# Patient Record
Sex: Female | Born: 1940
Health system: Southern US, Community
[De-identification: ages and names within clinical notes are randomized; demographics above are authoritative.]

## PROBLEM LIST (undated history)

## (undated) DIAGNOSIS — E785 Hyperlipidemia, unspecified: Secondary | ICD-10-CM

## (undated) DIAGNOSIS — H409 Unspecified glaucoma: Secondary | ICD-10-CM

## (undated) DIAGNOSIS — K219 Gastro-esophageal reflux disease without esophagitis: Secondary | ICD-10-CM

## (undated) DIAGNOSIS — I10 Essential (primary) hypertension: Secondary | ICD-10-CM

## (undated) DIAGNOSIS — K5790 Diverticulosis of intestine, part unspecified, without perforation or abscess without bleeding: Secondary | ICD-10-CM

## (undated) DIAGNOSIS — M1990 Unspecified osteoarthritis, unspecified site: Secondary | ICD-10-CM

## (undated) DIAGNOSIS — K649 Unspecified hemorrhoids: Secondary | ICD-10-CM

## (undated) HISTORY — DX: Hyperlipidemia, unspecified: E78.5

## (undated) HISTORY — DX: Diverticulosis of intestine, part unspecified, without perforation or abscess without bleeding: K57.90

## (undated) HISTORY — DX: Essential (primary) hypertension: I10

## (undated) HISTORY — DX: Gastro-esophageal reflux disease without esophagitis: K21.9

## (undated) HISTORY — PX: FOOT SURGERY: SHX648

## (undated) HISTORY — DX: Unspecified osteoarthritis, unspecified site: M19.90

## (undated) HISTORY — DX: Unspecified glaucoma: H40.9

## (undated) HISTORY — DX: Unspecified hemorrhoids: K64.9

---

## 2000-05-08 ENCOUNTER — Ambulatory Visit (HOSPITAL_COMMUNITY): Admission: RE | Admit: 2000-05-08 | Discharge: 2000-05-08 | Payer: Self-pay | Admitting: Internal Medicine

## 2000-05-09 ENCOUNTER — Encounter: Payer: Self-pay | Admitting: Internal Medicine

## 2000-07-04 ENCOUNTER — Ambulatory Visit (HOSPITAL_COMMUNITY): Admission: RE | Admit: 2000-07-04 | Discharge: 2000-07-04 | Payer: Self-pay | Admitting: Neurosurgery

## 2000-08-05 ENCOUNTER — Ambulatory Visit (HOSPITAL_COMMUNITY): Admission: RE | Admit: 2000-08-05 | Discharge: 2000-08-05 | Payer: Self-pay | Admitting: Neurosurgery

## 2002-01-28 ENCOUNTER — Encounter (INDEPENDENT_AMBULATORY_CARE_PROVIDER_SITE_OTHER): Payer: Self-pay | Admitting: Internal Medicine

## 2002-01-28 LAB — CONVERTED CEMR LAB: Pap Smear: NORMAL

## 2002-11-23 ENCOUNTER — Ambulatory Visit (HOSPITAL_COMMUNITY): Admission: RE | Admit: 2002-11-23 | Discharge: 2002-11-23 | Payer: Self-pay | Admitting: General Surgery

## 2002-11-29 ENCOUNTER — Ambulatory Visit (HOSPITAL_COMMUNITY): Admission: RE | Admit: 2002-11-29 | Discharge: 2002-11-29 | Payer: Self-pay | Admitting: General Surgery

## 2005-04-02 ENCOUNTER — Ambulatory Visit: Payer: Self-pay | Admitting: Internal Medicine

## 2005-04-02 LAB — CONVERTED CEMR LAB: Pap Smear: NORMAL

## 2005-07-26 ENCOUNTER — Ambulatory Visit: Payer: Self-pay | Admitting: Internal Medicine

## 2005-10-02 ENCOUNTER — Ambulatory Visit: Payer: Self-pay | Admitting: Internal Medicine

## 2005-12-03 ENCOUNTER — Ambulatory Visit: Payer: Self-pay | Admitting: Internal Medicine

## 2005-12-03 ENCOUNTER — Ambulatory Visit (HOSPITAL_COMMUNITY): Admission: RE | Admit: 2005-12-03 | Discharge: 2005-12-03 | Payer: Self-pay | Admitting: Internal Medicine

## 2005-12-10 ENCOUNTER — Ambulatory Visit: Payer: Self-pay | Admitting: Internal Medicine

## 2005-12-23 ENCOUNTER — Ambulatory Visit (HOSPITAL_COMMUNITY): Admission: RE | Admit: 2005-12-23 | Discharge: 2005-12-23 | Payer: Self-pay | Admitting: Internal Medicine

## 2005-12-23 ENCOUNTER — Ambulatory Visit: Payer: Self-pay | Admitting: Internal Medicine

## 2005-12-23 HISTORY — PX: COLONOSCOPY: SHX174

## 2006-02-26 ENCOUNTER — Encounter: Payer: Self-pay | Admitting: Internal Medicine

## 2006-02-26 DIAGNOSIS — G4700 Insomnia, unspecified: Secondary | ICD-10-CM | POA: Insufficient documentation

## 2006-02-26 DIAGNOSIS — M25559 Pain in unspecified hip: Secondary | ICD-10-CM | POA: Insufficient documentation

## 2006-02-26 DIAGNOSIS — M1990 Unspecified osteoarthritis, unspecified site: Secondary | ICD-10-CM | POA: Insufficient documentation

## 2006-02-26 DIAGNOSIS — K219 Gastro-esophageal reflux disease without esophagitis: Secondary | ICD-10-CM | POA: Insufficient documentation

## 2006-02-26 DIAGNOSIS — G5600 Carpal tunnel syndrome, unspecified upper limb: Secondary | ICD-10-CM | POA: Insufficient documentation

## 2006-02-26 DIAGNOSIS — M545 Low back pain, unspecified: Secondary | ICD-10-CM | POA: Insufficient documentation

## 2006-05-30 ENCOUNTER — Encounter (INDEPENDENT_AMBULATORY_CARE_PROVIDER_SITE_OTHER): Payer: Self-pay | Admitting: Internal Medicine

## 2006-06-02 ENCOUNTER — Telehealth (INDEPENDENT_AMBULATORY_CARE_PROVIDER_SITE_OTHER): Payer: Self-pay | Admitting: Internal Medicine

## 2006-06-02 ENCOUNTER — Ambulatory Visit: Payer: Self-pay | Admitting: Internal Medicine

## 2006-06-09 ENCOUNTER — Encounter (INDEPENDENT_AMBULATORY_CARE_PROVIDER_SITE_OTHER): Payer: Self-pay | Admitting: Internal Medicine

## 2006-06-11 ENCOUNTER — Encounter (INDEPENDENT_AMBULATORY_CARE_PROVIDER_SITE_OTHER): Payer: Self-pay | Admitting: Internal Medicine

## 2006-11-19 ENCOUNTER — Ambulatory Visit: Payer: Self-pay | Admitting: Internal Medicine

## 2006-11-19 DIAGNOSIS — S5010XA Contusion of unspecified forearm, initial encounter: Secondary | ICD-10-CM | POA: Insufficient documentation

## 2006-11-19 DIAGNOSIS — R030 Elevated blood-pressure reading, without diagnosis of hypertension: Secondary | ICD-10-CM | POA: Insufficient documentation

## 2006-11-19 LAB — CONVERTED CEMR LAB
INR: 1 (ref 0.0–1.5)
Prothrombin Time: 13.8 s (ref 11.6–15.2)
aPTT: 30 s (ref 24–37)

## 2006-11-20 ENCOUNTER — Telehealth (INDEPENDENT_AMBULATORY_CARE_PROVIDER_SITE_OTHER): Payer: Self-pay | Admitting: *Deleted

## 2006-11-20 LAB — CONVERTED CEMR LAB
Basophils Absolute: 0 10*3/uL (ref 0.0–0.1)
Basophils Relative: 1 % (ref 0–1)
CO2: 26 meq/L (ref 19–32)
Calcium: 9.9 mg/dL (ref 8.4–10.5)
Chloride: 103 meq/L (ref 96–112)
HDL: 48 mg/dL (ref >39)
LDL Cholesterol: 133 mg/dL — ABNORMAL HIGH (ref 0–99)
Lymphocytes Relative: 24 % (ref 12–46)
Neutro Abs: 4.8 10*3/uL (ref 1.7–7.7)
Platelets: 258 10*3/uL (ref 150–400)
RDW: 13.3 % (ref 11.5–14.0)
Sodium: 142 meq/L (ref 135–145)
Total CHOL/HDL Ratio: 4.4
Triglycerides: 149 mg/dL (ref <150)

## 2007-03-26 ENCOUNTER — Ambulatory Visit: Payer: Self-pay | Admitting: Internal Medicine

## 2007-03-26 DIAGNOSIS — M5430 Sciatica, unspecified side: Secondary | ICD-10-CM | POA: Insufficient documentation

## 2007-09-11 ENCOUNTER — Ambulatory Visit (HOSPITAL_COMMUNITY): Admission: RE | Admit: 2007-09-11 | Discharge: 2007-09-11 | Payer: Self-pay | Admitting: Family Medicine

## 2007-09-25 ENCOUNTER — Encounter (INDEPENDENT_AMBULATORY_CARE_PROVIDER_SITE_OTHER): Payer: Self-pay | Admitting: Internal Medicine

## 2007-11-20 ENCOUNTER — Ambulatory Visit (HOSPITAL_COMMUNITY): Admission: RE | Admit: 2007-11-20 | Discharge: 2007-11-20 | Payer: Self-pay | Admitting: Family Medicine

## 2009-11-15 ENCOUNTER — Ambulatory Visit (HOSPITAL_COMMUNITY): Admission: RE | Admit: 2009-11-15 | Discharge: 2009-11-15 | Payer: Self-pay | Admitting: Family Medicine

## 2010-06-15 NOTE — Op Note (Signed)
. [facility]  Patient:    Katherine Hoffman, Katherine Hoffman                      MRN: [id number] Proc. Date: 07/04/00 Adm. Date:  [id number] Disc. Date: [id number] Attending:  Emeterio Reeve                           Operative Report  PREOPERATIVE DIAGNOSIS:  Right carpal tunnel syndrome.  POSTOPERATIVE DIAGNOSIS:  Right carpal tunnel syndrome.  PROCEDURE:  Right carpal tunnel release.  SURGEON:  Payton Doughty, M.D.  ANESTHESIA:  1% local lidocaine.  COMPLICATIONS:  None.  DESCRIPTION OF PROCEDURE:  A 70 year old right-handed white lady with right carpal tunnel.  She was taken to the operating room and following shave, prep, and drape in the usual sterile fashion, the skin on the palmar aspect of the right hand just distal to the wrist crease in a line between the third and fourth rays was anesthetized with 1% lidocaine.  A 1 cm skin incision was made and the subcutaneous fat dissected, identifying the transverse carpal ligament.  It was opened in its midportion, and then working superficially and deeply to the ligament along the median nerve, the ligament was divided from its most proximal to its most distal extent.  This resulted in immediate decompression of the right median nerve.  It was carefully explored and found to have no compressive pathology remaining.  The wound was irrigated and hemostasis assured.  The skin was closed with a single layer of 3-0 nylon in an interrupted vertical mattress fashion.  A Betadine Telfa dressing was applied, and the patient returned to the recovery room in good condition. DD:  07/03/00 TD:  07/04/00 Job: [id number]

## 2010-06-15 NOTE — Consult Note (Signed)
NAMEFABIANA, [name]             ACCOUNT NO.:  [id number]   MEDICAL RECORD NO.:  [id number]          PATIENT TYPE:  AMB   LOCATION:  DAY                           FACILITY:  [facility]   PHYSICIAN:  R. Roetta Sessions, M.D. DATE OF BIRTH:  [birth year]   DATE OF CONSULTATION:  DATE OF DISCHARGE:                                   CONSULTATION   GI CONSULT   REASON FOR CONSULTATION:  Chronic constipation/needs colonoscopy.   HISTORY OF PRESENT ILLNESS:  Katherine Hoffman is a 70 year old Caucasian female who  has a long standing history of chronic constipation.  She generally goes  between 2 and 3 days without a bowel movement.  She has been trying generic  Colace recently with good results. She has also tried to increased her  dietary fiber.  She denies any laxative use.  She denies any rectal  bleeding, melena or abdominal pain.  She denies any nausea or vomiting.  She  has lost 20 pounds in the last 18 months, however, she attributes this to  losing her husband due to colon cancer and increased physical activities.   PAST MEDICAL HISTORY:  1. Arthritis.  2. Insomnia.  3. Low back pain, rare.  4. Gastroesophageal reflux disease.  5. Right hip pain.  6. Bilateral carpal tunnel repair.  7. Tubal ligation.   CURRENT MEDICATIONS:  1. Naproxen 500 mg b.i.d. as needed.  2. Lorazepam 0.5 to 1 mg every h.s.  3. Fish oil 1200 mg b.i.d.  4. Colace 0-5 mg as needed.   ALLERGIES:  KEFLEX, THEODUR, and ANTIHISTAMINES.   FAMILY HISTORY:  Positive for father diagnosed in his mid to late 52s with  colon cancer, deceased when he was 3.  Mother age 43, deceased secondary to  MI.  She has multiple siblings with noncontributory histories.   SOCIAL HISTORY:  This is a widow.  She lost her husband to colon cancer  within the last 2 years.  She has 1 healthy daughter.  She retired from  [employer], denies any tobacco or drug use.  She rarely consumes  alcoholic beverages.   REVIEW OF SYMPTOMS:   CONSTITUTIONAL:  See HPI.  Denies any fever or chills.  Denies any fatigue.  CARDIOVASCULAR:  No __________ .  No cough or  hemoptysis.  GI:  See HPI. Denies any dysphagia, odynophagia, anorexia or  early satiety, heartburn or indigestion at this time.  ENDOCRINE:  Denies  any problems with diabetes mellitus or thyroid disorders.   PHYSICAL EXAMINATION:  VITAL SIGNS:  Weight 125 pounds, height 62 inches,  temperature 99.2, blood pressure 124/84, pulses 64.  GENERAL:  This was a well developed, well nourished Caucasian female in no  acute distress.  HEENT:  Normocephalic and atraumatic.  CHEST: Heart regular rate and rhythm, normal S1, S2.  Without murmurs,  clicks, rubs or gallops.  LUNGS:  Clear to auscultation bilaterally.  ABDOMEN:  With positive bowel sounds x 4.  No lesion.  EXTREMITIES:  Without clubbing or edema bilaterally.  RECTAL: Deferred.   IMPRESSION:  This is a 70 -year-old Caucasian female with history of  chronic  constipation.  She also has a family history of colon cancer in her father,  diagnosed in his mid to late 42s.  She has not had colonoscopy since 1992.   PLAN:  1. Constipation.  She was given __________ .  2. High fiber diet.  3. We have given her fiber samples and she can take fiber of choice on      daily basis.  4. Stool softeners as needed.  5. __________ stool softeners as needed.  6. Colonoscopy with Dr. Kendell Bane in the near future.  I've discussed the      procedure and the risks and benefits, including but not limited to      bleeding, infection, perforation or drug reaction.  She understand and      consent is obtained.   We would like to thank Dr. Aviva Signs for allowing to participate in the  care of Katherine Hoffman.      Nicholas Lose, N.P.      Jonathon Bellows, M.D.  Electronically Signed    KC/MEDQ  D:  12/10/2005  T:  12/10/2005  Job:  [id number]   cc:   Erle Crocker, M.D.

## 2010-06-15 NOTE — Op Note (Signed)
[place]  Patient:    Katherine Hoffman, Katherine Hoffman                      MRN: [id number] Proc. Date: 08/05/00 Adm. Date:  [id number] Disc. Date: [id number] Attending:  Emeterio Reeve                           Operative Report  PREOPERATIVE DIAGNOSIS:  Left carpal tunnel syndrome.  POSTOPERATIVE DIAGNOSIS:  Left carpal tunnel syndrome.  PROCEDURE:  Left carpal tunnel release.  SURGEON:  Payton Doughty, M.D.  ANESTHESIA:  1% local lidocaine.  COMPLICATIONS:  None.  DESCRIPTION OF PROCEDURE:  A 70 year old lady with left carpal tunnel syndrome.  She was taken to the operating room.  Following shave, prep, and drape in the usual sterile fashion, 1% lidocaine was injected in a line between the third and fourth rays on the left hand 1 cm distal to the proximal wrist crease.  The incision was made through the skin and palmar fat down to the transverse carpal ligament, which was divided.  It was then carefully dissected over top of the median nerve down into the wrist, down into the palm.  Following complete release of the nerve, the wound was irrigated.  The skin was closed as a single layer in interrupted vertical mattress fashion using 3-0 nylon.  A Betadine Telfa dressing was applied and a standard hand wrap applied.  The patient returned to the recovery room in good condition. DD:  08/05/00 TD:  08/05/00 Job: [id number]

## 2010-06-15 NOTE — Op Note (Signed)
Katherine Hoffman, Katherine Hoffman             ACCOUNT NO.:  [id number]   MEDICAL RECORD NO.:  [id number]          PATIENT TYPE:  AMB   LOCATION:  DAY                           FACILITY:  [facility]   PHYSICIAN:  R. Roetta Sessions, M.D. DATE OF BIRTH:  1940-05-14   DATE OF PROCEDURE:  12/23/2005  DATE OF DISCHARGE:                                 OPERATIVE REPORT   PROCEDURE:  Screening colonoscopy.   INDICATIONS FOR PROCEDURE:  The patient is 70 year old lady with chronic  constipation with a positive family history of colon cancer in her father,  who was diagnosed in his 95s.  She is here for colonoscopy.  Last  colonoscopy was in 1992.  This approach has been discussed with the patient  at length.  Potential risks, benefits alternatives have been reviewed,  questions answered.  She is agreeable.  Please see the documentation in the  medical record.   PROCEDURE NOTE:  O2 saturation, blood pressure, pulse and respirations were  monitored throughout the entire procedure.   CONSCIOUS SEDATION:  Versed 4 mg IV, Demerol 100 mg IV in divided doses.   INSTRUMENT USED:  Olympus video chip system.   FINDINGS:  Digital rectal exam revealed no abnormalities.  Endoscopic  findings:  The prep was good.   Examination of the colonic mucosa from the rectosigmoid junction through the  left. Transverse, right colon to the area of the appendiceal orifice was  undertaken.  These structures were well-seen and photographed for the  record.  From this level the scope was slowly withdrawn and all previously-  mentioned mucosal surfaces were again seen.  The patient had scattered  pancolonic diverticula.  Remainder of the colonic mucosa appeared normal.  The rectal vault was small and I was unable to retroflex orifice; however,  for the same reason I was able to see the rectal mucosa very well en face,  and it appeared normal.  The patient tolerated the procedure well and was  reacted in endoscopy.   IMPRESSION:  1.  Normal rectum.  2. Few scattered pancolonic diverticula.  3. Remainder of the colonic mucosa appeared normal.   RECOMMENDATIONS:  1. Diverticulosis literature provided to [name].  2. Repeat high-risk screening colonoscopy 5 years.      Jonathon Bellows, M.D.  Electronically Signed     RMR/MEDQ  D:  12/23/2005  T:  12/23/2005  Job:  [id number]   cc:   Dr. Donalda Ewings

## 2010-10-26 ENCOUNTER — Encounter: Payer: Self-pay | Admitting: Internal Medicine

## 2010-10-29 ENCOUNTER — Other Ambulatory Visit: Payer: Self-pay | Admitting: Family Medicine

## 2010-10-29 ENCOUNTER — Ambulatory Visit (HOSPITAL_COMMUNITY)
Admission: RE | Admit: 2010-10-29 | Discharge: 2010-10-29 | Disposition: A | Discharge status: Home / Self Care | Payer: Medicare Other | Source: Ambulatory Visit | Attending: Family Medicine | Admitting: Family Medicine

## 2010-10-29 DIAGNOSIS — R05 Cough: Secondary | ICD-10-CM | POA: Insufficient documentation

## 2010-10-29 DIAGNOSIS — R059 Cough, unspecified: Secondary | ICD-10-CM | POA: Insufficient documentation

## 2011-02-28 ENCOUNTER — Ambulatory Visit (INDEPENDENT_AMBULATORY_CARE_PROVIDER_SITE_OTHER): Payer: Medicare Other | Admitting: Otolaryngology

## 2011-02-28 ENCOUNTER — Ambulatory Visit (INDEPENDENT_AMBULATORY_CARE_PROVIDER_SITE_OTHER): Payer: No Typology Code available for payment source | Admitting: Otolaryngology

## 2011-02-28 DIAGNOSIS — H903 Sensorineural hearing loss, bilateral: Secondary | ICD-10-CM

## 2012-08-14 ENCOUNTER — Other Ambulatory Visit (HOSPITAL_COMMUNITY): Payer: Self-pay | Admitting: Family Medicine

## 2012-08-14 NOTE — Telephone Encounter (Signed)
Ok times one plus two ref needs ov before further.

## 2012-08-14 NOTE — Telephone Encounter (Signed)
Last office visit 10/14/11. Xanax last refilled 02/17/12 with 5 monthly refills.

## 2012-10-14 ENCOUNTER — Other Ambulatory Visit (HOSPITAL_COMMUNITY): Payer: Self-pay | Admitting: Family Medicine

## 2012-11-13 ENCOUNTER — Telehealth: Payer: Self-pay | Admitting: Internal Medicine

## 2012-11-13 ENCOUNTER — Encounter: Payer: Self-pay | Admitting: Family Medicine

## 2012-11-13 ENCOUNTER — Ambulatory Visit (INDEPENDENT_AMBULATORY_CARE_PROVIDER_SITE_OTHER): Payer: Medicare Other | Admitting: Family Medicine

## 2012-11-13 ENCOUNTER — Other Ambulatory Visit (HOSPITAL_COMMUNITY): Payer: Self-pay | Admitting: Family Medicine

## 2012-11-13 VITALS — BP 130/90 | Temp 98.5°F | Ht 62.5 in | Wt 122.6 lb

## 2012-11-13 DIAGNOSIS — Z79899 Other long term (current) drug therapy: Secondary | ICD-10-CM

## 2012-11-13 DIAGNOSIS — K219 Gastro-esophageal reflux disease without esophagitis: Secondary | ICD-10-CM

## 2012-11-13 DIAGNOSIS — E785 Hyperlipidemia, unspecified: Secondary | ICD-10-CM

## 2012-11-13 DIAGNOSIS — R300 Dysuria: Secondary | ICD-10-CM

## 2012-11-13 DIAGNOSIS — R030 Elevated blood-pressure reading, without diagnosis of hypertension: Secondary | ICD-10-CM

## 2012-11-13 DIAGNOSIS — M1990 Unspecified osteoarthritis, unspecified site: Secondary | ICD-10-CM

## 2012-11-13 DIAGNOSIS — G4700 Insomnia, unspecified: Secondary | ICD-10-CM

## 2012-11-13 DIAGNOSIS — R7301 Impaired fasting glucose: Secondary | ICD-10-CM

## 2012-11-13 LAB — POCT URINALYSIS DIPSTICK: pH, UA: 8

## 2012-11-13 MED ORDER — OMEPRAZOLE 20 MG PO CPDR: 20.0000 mg | DELAYED_RELEASE_CAPSULE | Freq: Every day | ORAL | Status: DC

## 2012-11-13 MED ORDER — CIPROFLOXACIN HCL 250 MG PO TABS: 250.0000 mg | ORAL_TABLET | Freq: Two times a day (BID) | ORAL | Status: AC

## 2012-11-13 NOTE — Telephone Encounter (Signed)
Patient needs Rx for omeprazole to [facility] If chart is needed, please let me know. Patient has appointment this afternoon.

## 2012-11-13 NOTE — Progress Notes (Signed)
  Subjective:    Patient ID: Katherine Hoffman, female    DOB: February 15, 1940, 72 y.o.   MRN: [id number]  HPI Patient is here today b/c she is frequently having to void. She had pain yesterday in her abdomen, but no longer has pain now. She just keeps have the urge to void. These symptoms started about 4 days ago. No obvious fever no chills.   Results for orders placed in visit on 11/13/12  POCT URINALYSIS DIPSTICK      Result Value Range   Color, UA       Clarity, UA       Glucose, UA       Bilirubin, UA       Ketones, UA       Spec Grav, UA <=1.005     Blood, UA       pH, UA 8.0     Protein, UA       Urobilinogen, UA       Nitrite, UA       Leukocytes, UA 4+     Cranberry prn 4200 mg supplement  Has significant trouble sleeping. She uses Xanax for this. She needs refill.  Long-standing history of reflux. Reports that when necessary use of omeprazole helped considerably. No side effects from it  Patient has history of impaired fasting glucose needs blood work to assess. Also history of hyperlipidemia. Uncertain status.  Review of Systems No chest pain no back pain no domino pain no change in bowel habits no blood in stool    Objective:   Physical Exam Alert no apparent distress. Lungs clear. Heart regular rate and rhythm. HEENT normal. No CVA tenderness. Abdomen benign.  Urinalysis 60 white blood cells per high-power field.       Assessment & Plan:  Impression #1 UTI discussed #2 reflux discussed #3 impaired fasting glucose status uncertain. #4 hyperlipidemia status uncertain. #5 insomnia patient needs Xanax for this. Plan antibiotics prescribed. Chronic medicines refilled. Appropriate blood work. Diet and exercise discussed. WSL

## 2012-11-13 NOTE — Telephone Encounter (Signed)
Has appointment today at 4 pm.

## 2012-11-13 NOTE — Telephone Encounter (Signed)
Last seen

## 2012-11-13 NOTE — Telephone Encounter (Signed)
RX refill sent in to pharmacy. Patient was notified.

## 2012-11-15 DIAGNOSIS — R7301 Impaired fasting glucose: Secondary | ICD-10-CM | POA: Insufficient documentation

## 2012-11-17 LAB — HEPATIC FUNCTION PANEL
AST: 24 U/L (ref 0–37)
Albumin: 4.6 g/dL (ref 3.5–5.2)
Bilirubin, Direct: 0.2 mg/dL (ref 0.0–0.3)
Total Bilirubin: 1 mg/dL (ref 0.3–1.2)

## 2012-11-17 LAB — BASIC METABOLIC PANEL
CO2: 28 mEq/L (ref 19–32)
Calcium: 9.9 mg/dL (ref 8.4–10.5)
Chloride: 104 mEq/L (ref 96–112)
Glucose, Bld: 112 mg/dL — ABNORMAL HIGH (ref 70–99)
Potassium: 4.4 mEq/L (ref 3.5–5.3)
Sodium: 139 mEq/L (ref 135–145)

## 2012-11-17 LAB — LIPID PANEL
HDL: 42 mg/dL (ref >39)
LDL Cholesterol: 127 mg/dL — ABNORMAL HIGH (ref 0–99)
Total CHOL/HDL Ratio: 5 Ratio

## 2012-11-25 ENCOUNTER — Encounter: Payer: Medicare Other | Admitting: Nurse Practitioner

## 2012-12-02 NOTE — Telephone Encounter (Signed)
Ok both plus 5 ref

## 2012-12-02 NOTE — Telephone Encounter (Signed)
i can't x this one out. Have we refilled/ if not do so

## 2013-03-29 ENCOUNTER — Ambulatory Visit (INDEPENDENT_AMBULATORY_CARE_PROVIDER_SITE_OTHER): Payer: Medicare Other | Admitting: Family Medicine

## 2013-03-29 ENCOUNTER — Encounter: Payer: Self-pay | Admitting: Family Medicine

## 2013-03-29 VITALS — BP 130/80 | Temp 98.7°F | Ht 62.5 in | Wt 122.5 lb

## 2013-03-29 DIAGNOSIS — N390 Urinary tract infection, site not specified: Secondary | ICD-10-CM

## 2013-03-29 LAB — POCT URINALYSIS DIPSTICK
Spec Grav, UA: <=1.005
pH, UA: 5

## 2013-03-29 MED ORDER — CIPROFLOXACIN HCL 250 MG PO TABS: 250.0000 mg | ORAL_TABLET | Freq: Two times a day (BID) | ORAL | Status: DC

## 2013-03-29 NOTE — Progress Notes (Signed)
   Subjective:    Patient ID: Katherine Hoffman, female    DOB: Oct 21, 1940, 73 y.o.   MRN: [id number]  Urinary Tract Infection  This is a new problem. The current episode started in the past 7 days. The problem occurs every urination. The problem has been unchanged. The quality of the pain is described as burning. The pain is at a severity of 8/10. The pain is moderate. She has tried increased fluids for the symptoms. The treatment provided mild relief.   occas nocturia  Some dysuria No hx of UTIs recently  Had a spell twenty yrs ago had to see a urologist, stretched the bladder stem   Review of Systems No fever no chills no back pain no abdominal pain no change in bowel habits ROS otherwise negative alert no apparent distress. Vitals stable. HEENT normal. Lungs clear. Heart regular in rhythm. No CVA tenderness.    Objective:   Physical Exam   See above  Urinalysis 6-8 white blood cells per high-power field     Assessment & Plan:  Impression urinary tract infection second UTI in 5 months. Not enough to warrant full workup. Discussed at length. Plan Cipro 250 twice a day 10 days. Symptomatic care discussed. WSL

## 2013-03-31 ENCOUNTER — Telehealth: Payer: Self-pay

## 2013-03-31 NOTE — Telephone Encounter (Signed)
Pt called and is past due for her high risk screening colonoscopy. Her last one was done 12/23/2005 and DR. Rourk recommended the next one in 5 years. Her father had colon cancer in his 6970's but that is not what he deceased from.  She has seen blood in her stool recently a couple of times.  Scheduled OV with Gerrit HallsAnna Sams, NP on 04/26/2013 at 10:30 AM.

## 2013-04-09 ENCOUNTER — Other Ambulatory Visit: Payer: Self-pay | Admitting: Family Medicine

## 2013-04-09 NOTE — Telephone Encounter (Signed)
Last seen 03/29/13 (sick)

## 2013-04-26 ENCOUNTER — Encounter (INDEPENDENT_AMBULATORY_CARE_PROVIDER_SITE_OTHER): Payer: Self-pay

## 2013-04-26 ENCOUNTER — Encounter: Payer: Self-pay | Admitting: Gastroenterology

## 2013-04-26 ENCOUNTER — Ambulatory Visit (INDEPENDENT_AMBULATORY_CARE_PROVIDER_SITE_OTHER): Payer: Medicare Other | Admitting: Gastroenterology

## 2013-04-26 VITALS — BP 164/82 | HR 66 | Temp 97.3°F | Ht 62.0 in | Wt 122.4 lb

## 2013-04-26 DIAGNOSIS — K625 Hemorrhage of anus and rectum: Secondary | ICD-10-CM

## 2013-04-26 MED ORDER — PEG 3350-KCL-NA BICARB-NACL 420 G PO SOLR: 4000.0000 mL | ORAL | Status: DC

## 2013-04-26 NOTE — Progress Notes (Signed)
    Primary Care Physician:  LUKING,W S, MD Primary Gastroenterologist:  Dr. Rourk  Chief Complaint  Patient presents with  . Colonoscopy    HPI:   Katherine Hoffman presents today to discuss screening colonoscopy. There is some question of of FH of colon cancer; she believes her father may have had colon cancer but is not convinced this is the case. He did not pass away from colon cancer. States she had been constipated in the recent past and noted low volume hematochezia with this. No rectal discomfort. No other significant change in bowel habits. Takes Senna only as needed. No abdominal pain. Hx of GERD, takes Prilosec daily. No GERD exacerbations.   Past Medical History  Diagnosis Date  . GERD (gastroesophageal reflux disease)     Past Surgical History  Procedure Laterality Date  . Colonoscopy   12/23/2005    Dr. Rourk:Few scattered pancolonic diverticula/Remainder of the colonic mucosa appeared normal/normal rectum  . Foot surgery      Current Outpatient Prescriptions  Medication Sig Dispense Refill  . ALPRAZolam (XANAX) 1 MG tablet TAKE (1) TABLET BY MOUTH AT BEDTIME AS NEEDED.  30 tablet  5  . aspirin EC 81 MG tablet Take 81 mg by mouth daily.      . Cinnamon 500 MG TABS Take 1,000 mg by mouth daily.      . CRANBERRY PO Take 300 mg by mouth daily.      . Multiple Vitamin (MULTIVITAMIN) capsule Take 1 capsule by mouth daily.      . naproxen (NAPROSYN) 500 MG tablet TAKE (1) TABLET BY MOUTH TWICE A DAY AS NEEDED.  60 tablet  5  . Omega-3 Fatty Acids (OMEGA-3 FISH OIL PO) Take 1,000 mg by mouth daily.      . omeprazole (PRILOSEC) 40 MG capsule TAKE (1) CAPSULE BY MOUTH EACH MORNING.  30 capsule  5   No current facility-administered medications for this visit.    Allergies as of 04/26/2013 - Review Complete 04/26/2013  Allergen Reaction Noted  . Keflex [cephalexin]  11/13/2012  . Theophyllines  11/13/2012    Family History  Problem Relation Age of Onset  . Colon  cancer      POSSIBLY FATHER    History   Social History  . Marital Status: Widowed    Spouse Name: N/A    Number of Children: N/A  . Years of Education: N/A   Occupational History  . Retired     [employer]   Social History Main Topics  . Smoking status: Never Smoker   . Smokeless tobacco: Not on file  . Alcohol Use: Not on file  . Drug Use: No  . Sexual Activity: Not on file   Other Topics Concern  . Not on file   Social History Narrative  . No narrative on file    Review of Systems: Negative as mentioned in HPI.   Physical Exam: BP 164/82  Pulse 66  Temp(Src) 97.3 F (36.3 C) (Oral)  Ht 5' 2" (1.575 m)  Wt 122 lb 6.4 oz (55.52 kg)  BMI 22.38 kg/m2 General:   Alert and oriented. Pleasant and cooperative. Well-nourished and well-developed.  Head:  Normocephalic and atraumatic. Eyes:  Without icterus, sclera clear and conjunctiva pink.  Ears:  Normal auditory acuity. Nose:  No deformity, discharge,  or lesions. Mouth:  No deformity or lesions, oral mucosa pink.  Neck:  Supple, without mass or thyromegaly. Lungs:  Clear to auscultation bilaterally. No wheezes,   rales, or rhonchi. No distress.  Heart:  S1, S2 present without murmurs appreciated.  Abdomen:  +BS, soft, non-tender and non-distended. No HSM noted. No guarding or rebound. No masses appreciated.  Rectal:  Deferred  Msk:  Symmetrical without gross deformities. Normal posture. Extremities:  Without clubbing or edema. Neurologic:  Alert and  oriented x4;  grossly normal neurologically. Skin:  Intact without significant lesions or rashes. Cervical Nodes:  No significant cervical adenopathy. Psych:  Alert and cooperative. Normal mood and affect.

## 2013-04-26 NOTE — Patient Instructions (Signed)
We have scheduled you for a colonoscopy with Dr. Rourk in the near future.  Further recommendations to follow!   

## 2013-04-26 NOTE — Assessment & Plan Note (Signed)
73 year old female with low-volume hematochezia in the setting of recent constipation and possible family history of colon cancer in first degree relative (father). She is not entirely sure if this was his actual diagnosis; last colonoscopy in 2007. Due to uncertainty of family history, it would be wisest to proceed with a colonoscopy at this time to rule out any occult issues. Likely rectal bleeding benign but unable to definitely say without lower GI evaluation.  Proceed with TCS with Dr. Jena Gaussourk in near future: the risks, benefits, and alternatives have been discussed with the patient in detail. The patient states understanding and desires to proceed.

## 2013-04-27 NOTE — Progress Notes (Signed)
cc'd to pcp 

## 2013-04-28 ENCOUNTER — Encounter (HOSPITAL_COMMUNITY): Payer: Self-pay

## 2013-05-14 ENCOUNTER — Encounter (HOSPITAL_COMMUNITY): Payer: Self-pay | Admitting: *Deleted

## 2013-05-14 ENCOUNTER — Ambulatory Visit (HOSPITAL_COMMUNITY)
Admission: RE | Admit: 2013-05-14 | Discharge: 2013-05-14 | Disposition: A | Discharge status: Home / Self Care | Payer: Medicare Other | Source: Ambulatory Visit | Attending: Internal Medicine | Admitting: Internal Medicine

## 2013-05-14 ENCOUNTER — Encounter (HOSPITAL_COMMUNITY)
Admission: RE | Disposition: A | Discharge status: Home / Self Care | Payer: Self-pay | Source: Ambulatory Visit | Attending: Internal Medicine

## 2013-05-14 DIAGNOSIS — Z79899 Other long term (current) drug therapy: Secondary | ICD-10-CM | POA: Insufficient documentation

## 2013-05-14 DIAGNOSIS — K5730 Diverticulosis of large intestine without perforation or abscess without bleeding: Secondary | ICD-10-CM

## 2013-05-14 DIAGNOSIS — K648 Other hemorrhoids: Secondary | ICD-10-CM | POA: Insufficient documentation

## 2013-05-14 DIAGNOSIS — K219 Gastro-esophageal reflux disease without esophagitis: Secondary | ICD-10-CM | POA: Insufficient documentation

## 2013-05-14 DIAGNOSIS — K625 Hemorrhage of anus and rectum: Secondary | ICD-10-CM

## 2013-05-14 DIAGNOSIS — Z7982 Long term (current) use of aspirin: Secondary | ICD-10-CM | POA: Insufficient documentation

## 2013-05-14 DIAGNOSIS — K921 Melena: Secondary | ICD-10-CM | POA: Insufficient documentation

## 2013-05-14 HISTORY — PX: COLONOSCOPY: SHX5424

## 2013-05-14 SURGERY — COLONOSCOPY
Anesthesia: Moderate Sedation

## 2013-05-14 MED ORDER — MEPERIDINE HCL 100 MG/ML IJ SOLN
INTRAMUSCULAR | Status: DC | PRN
  Administered 2013-05-14: 50 mg via INTRAVENOUS
  Administered 2013-05-14: 25 mg via INTRAVENOUS

## 2013-05-14 MED ORDER — STERILE WATER FOR IRRIGATION IR SOLN
Status: DC | PRN
  Administered 2013-05-14: 09:00:00

## 2013-05-14 MED ORDER — ONDANSETRON HCL 4 MG/2ML IJ SOLN
INTRAMUSCULAR | Status: DC | PRN
  Administered 2013-05-14: 4 mg via INTRAVENOUS

## 2013-05-14 MED ORDER — MIDAZOLAM HCL 5 MG/5ML IJ SOLN
INTRAMUSCULAR | Status: AC
  Filled 2013-05-14: qty 10

## 2013-05-14 MED ORDER — ONDANSETRON HCL 4 MG/2ML IJ SOLN
INTRAMUSCULAR | Status: AC
  Filled 2013-05-14: qty 2

## 2013-05-14 MED ORDER — SODIUM CHLORIDE 0.9 % IV SOLN
INTRAVENOUS | Status: DC
  Administered 2013-05-14: 08:00:00 via INTRAVENOUS

## 2013-05-14 MED ORDER — MEPERIDINE HCL 100 MG/ML IJ SOLN
INTRAMUSCULAR | Status: AC
  Filled 2013-05-14: qty 2

## 2013-05-14 MED ORDER — MIDAZOLAM HCL 5 MG/5ML IJ SOLN
INTRAMUSCULAR | Status: DC | PRN
  Administered 2013-05-14 (×3): 1 mg via INTRAVENOUS
  Administered 2013-05-14: 2 mg via INTRAVENOUS

## 2013-05-14 NOTE — Op Note (Signed)
[facility] [address] [place], [id number]   COLONOSCOPY PROCEDURE REPORT  PATIENT: Katherine Hoffman, [name]  MR#:         [id number] BIRTHDATE: 1941-01-23 , 72  yrs. old GENDER: Female ENDOSCOPIST: R.  Roetta SessionsMichael Willett Lefeber, MD FACP [facility] REFERRED BY:  Simone CuriaStephen Luking, M.D. PROCEDURE DATE:  05/14/2013 PROCEDURE:     Diagnostic ileocolonoscopy  INDICATIONS: hematochezia  INFORMED CONSENT:  The risks, benefits, alternatives and imponderables including but not limited to bleeding, perforation as well as the possibility of a missed lesion have been reviewed.  The potential for biopsy, lesion removal, etc. have also been discussed.  Questions have been answered.  All parties agreeable. Please see the history and physical in the medical record for more information.  MEDICATIONS: Versed 5 mg IV and Demerol 75 mg IV in divided doses. Zofran 4 mg IV  DESCRIPTION OF PROCEDURE:  After a digital rectal exam was performed, the EC-3890Li ([id number]) and EC-[id number] ([id number]) colonoscope was advanced from the anus through the rectum and colon to the area of the cecum, ileocecal valve and appendiceal orifice. The cecum was deeply intubated.  These structures were well-seen and photographed for the record.  From the level of the cecum and ileocecal valve, the scope was slowly and cautiously withdrawn. The mucosal surfaces were carefully surveyed utilizing scope tip deflection to facilitate fold flattening as needed.  The scope was pulled down into the rectum where a thorough examination including retroflexion was performed.    FINDINGS:  Adequate preparation. Minimal internal hemorrhoids; otherwise, normal rectum. Scattered pancolonic diverticula; the remainder of the colonic mucosa appeared normal. The distal 5 cm of terminal ileum because also appeared normal.  THERAPEUTIC / DIAGNOSTIC MANEUVERS PERFORMED:  none  COMPLICATIONS: none  CECAL WITHDRAWAL TIME:  7  minutes  IMPRESSION:  Colonic diverticulosis. Internal hemorrhoids-likely source of hematochezia  RECOMMENDATIONS: Avoid constipation. Benefiber 2 teaspoons twice daily. MiraLax 1 capful bedtime as needed for constipation. Patient is to call if she has any recurrent bleeding  _______________________________ eSigned:  R. Roetta SessionsMichael Bannon Giammarco, MD FACP [facility] 05/14/2013 9:56 AM   CC:    PATIENT NAME:  Katherine Hoffman, [place] M. MR#: [id number]

## 2013-05-14 NOTE — H&P (View-Only) (Signed)
Primary Care Physician:  Harlow AsaLUKING,W S, MD Primary Gastroenterologist:  Dr. Jena Gaussourk  Chief Complaint  Patient presents with  . Colonoscopy    HPI:   Katherine Hoffman presents today to discuss screening colonoscopy. There is some question of of FH of colon cancer; she believes her father may have had colon cancer but is not convinced this is the case. He did not pass away from colon cancer. States she had been constipated in the recent past and noted low volume hematochezia with this. No rectal discomfort. No other significant change in bowel habits. Takes Senna only as needed. No abdominal pain. Hx of GERD, takes Prilosec daily. No GERD exacerbations.   Past Medical History  Diagnosis Date  . GERD (gastroesophageal reflux disease)     Past Surgical History  Procedure Laterality Date  . Colonoscopy   12/23/2005    Dr. Rourk:Few scattered pancolonic diverticula/Remainder of the colonic mucosa appeared normal/normal rectum  . Foot surgery      Current Outpatient Prescriptions  Medication Sig Dispense Refill  . ALPRAZolam (XANAX) 1 MG tablet TAKE (1) TABLET BY MOUTH AT BEDTIME AS NEEDED.  30 tablet  5  . aspirin EC 81 MG tablet Take 81 mg by mouth daily.      . Cinnamon 500 MG TABS Take 1,000 mg by mouth daily.      Marland Kitchen. CRANBERRY PO Take 300 mg by mouth daily.      . Multiple Vitamin (MULTIVITAMIN) capsule Take 1 capsule by mouth daily.      . naproxen (NAPROSYN) 500 MG tablet TAKE (1) TABLET BY MOUTH TWICE A DAY AS NEEDED.  60 tablet  5  . Omega-3 Fatty Acids (OMEGA-3 FISH OIL PO) Take 1,000 mg by mouth daily.      Marland Kitchen. omeprazole (PRILOSEC) 40 MG capsule TAKE (1) CAPSULE BY MOUTH EACH MORNING.  30 capsule  5   No current facility-administered medications for this visit.    Allergies as of 04/26/2013 - Review Complete 04/26/2013  Allergen Reaction Noted  . Keflex [cephalexin]  11/13/2012  . Theophyllines  11/13/2012    Family History  Problem Relation Age of Onset  . Colon  cancer      POSSIBLY FATHER    History   Social History  . Marital Status: Widowed    Spouse Name: N/A    Number of Children: N/A  . Years of Education: N/A   Occupational History  . Retired     [employer]   Social History Main Topics  . Smoking status: Never Smoker   . Smokeless tobacco: Not on file  . Alcohol Use: Not on file  . Drug Use: No  . Sexual Activity: Not on file   Other Topics Concern  . Not on file   Social History Narrative  . No narrative on file    Review of Systems: Negative as mentioned in HPI.   Physical Exam: BP 164/82  Pulse 66  Temp(Src) 97.3 F (36.3 C) (Oral)  Ht 5\' 2"  (1.575 m)  Wt 122 lb 6.4 oz (55.52 kg)  BMI 22.38 kg/m2 General:   Alert and oriented. Pleasant and cooperative. Well-nourished and well-developed.  Head:  Normocephalic and atraumatic. Eyes:  Without icterus, sclera clear and conjunctiva pink.  Ears:  Normal auditory acuity. Nose:  No deformity, discharge,  or lesions. Mouth:  No deformity or lesions, oral mucosa pink.  Neck:  Supple, without mass or thyromegaly. Lungs:  Clear to auscultation bilaterally. No wheezes,  rales, or rhonchi. No distress.  Heart:  S1, S2 present without murmurs appreciated.  Abdomen:  +BS, soft, non-tender and non-distended. No HSM noted. No guarding or rebound. No masses appreciated.  Rectal:  Deferred  Msk:  Symmetrical without gross deformities. Normal posture. Extremities:  Without clubbing or edema. Neurologic:  Alert and  oriented x4;  grossly normal neurologically. Skin:  Intact without significant lesions or rashes. Cervical Nodes:  No significant cervical adenopathy. Psych:  Alert and cooperative. Normal mood and affect.

## 2013-05-14 NOTE — Discharge Instructions (Addendum)
Colonoscopy Discharge Instructions  Read the instructions outlined below and refer to this sheet in the next few weeks. These discharge instructions provide you with general information on caring for yourself after you leave the hospital. Your doctor may also give you specific instructions. While your treatment has been planned according to the most current medical practices available, unavoidable complications occasionally occur. If you have any problems or questions after discharge, call Dr. Jena Gaussourk at [phone]. ACTIVITY  You may resume your regular activity, but move at a slower pace for the next 24 hours.   Take frequent rest periods for the next 24 hours.   Walking will help get rid of the air and reduce the bloated feeling in your belly (abdomen).   No driving for 24 hours (because of the medicine (anesthesia) used during the test).    Do not sign any important legal documents or operate any machinery for 24 hours (because of the anesthesia used during the test).  NUTRITION  Drink plenty of fluids.   You may resume your normal diet as instructed by your doctor.   Begin with a light meal and progress to your normal diet. Heavy or fried foods are harder to digest and may make you feel sick to your stomach (nauseated).   Avoid alcoholic beverages for 24 hours or as instructed.  MEDICATIONS  You may resume your normal medications unless your doctor tells you otherwise.  WHAT YOU CAN EXPECT TODAY  Some feelings of bloating in the abdomen.   Passage of more gas than usual.   Spotting of blood in your stool or on the toilet paper.  IF YOU HAD POLYPS REMOVED DURING THE COLONOSCOPY:  No aspirin products for 7 days or as instructed.   No alcohol for 7 days or as instructed.   Eat a soft diet for the next 24 hours.  FINDING OUT THE RESULTS OF YOUR TEST Not all test results are available during your visit. If your test results are not back during the visit, make an appointment  with your caregiver to find out the results. Do not assume everything is normal if you have not heard from your caregiver or the medical facility. It is important for you to follow up on all of your test results.  SEEK IMMEDIATE MEDICAL ATTENTION IF:  You have more than a spotting of blood in your stool.   Your belly is swollen (abdominal distention).   You are nauseated or vomiting.   You have a temperature over 101.   You have abdominal pain or discomfort that is severe or gets worse throughout the day.     Constipation diverticulosis information provided  Benefiber 2 teaspoons twice daily indefinitely  MiraLax one capsule at bedtime as needed for constipation  Call me if you have recurrent rectal bleeding  Constipation, Adult Constipation is when a person has fewer than 3 bowel movements a week; has difficulty having a bowel movement; or has stools that are dry, hard, or larger than normal. As people grow older, constipation is more common. If you try to fix constipation with medicines that make you have a bowel movement (laxatives), the problem may get worse. Long-term laxative use may cause the muscles of the colon to become weak. A low-fiber diet, not taking in enough fluids, and taking certain medicines may make constipation worse. CAUSES   Certain medicines, such as antidepressants, pain medicine, iron supplements, antacids, and water pills.   Certain diseases, such as diabetes, irritable bowel syndrome (IBS), thyroid  disease, or depression.   Not drinking enough water.   Not eating enough fiber-rich foods.   Stress or travel.  Lack of physical activity or exercise.  Not going to the restroom when there is the urge to have a bowel movement.  Ignoring the urge to have a bowel movement.  Using laxatives too much. SYMPTOMS   Having fewer than 3 bowel movements a week.   Straining to have a bowel movement.   Having hard, dry, or larger than normal stools.    Feeling full or bloated.   Pain in the lower abdomen.  Not feeling relief after having a bowel movement. DIAGNOSIS  Your caregiver will take a medical history and perform a physical exam. Further testing may be done for severe constipation. Some tests may include:   A barium enema X-ray to examine your rectum, colon, and sometimes, your small intestine.  A sigmoidoscopy to examine your lower colon.  A colonoscopy to examine your entire colon. TREATMENT  Treatment will depend on the severity of your constipation and what is causing it. Some dietary treatments include drinking more fluids and eating more fiber-rich foods. Lifestyle treatments may include regular exercise. If these diet and lifestyle recommendations do not help, your caregiver may recommend taking over-the-counter laxative medicines to help you have bowel movements. Prescription medicines may be prescribed if over-the-counter medicines do not work.  HOME CARE INSTRUCTIONS   Increase dietary fiber in your diet, such as fruits, vegetables, whole grains, and beans. Limit high-fat and processed sugars in your diet, such as [place] fries, hamburgers, cookies, candies, and soda.   A fiber supplement may be added to your diet if you cannot get enough fiber from foods.   Drink enough fluids to keep your urine clear or pale yellow.   Exercise regularly or as directed by your caregiver.   Go to the restroom when you have the urge to go. Do not hold it.  Only take medicines as directed by your caregiver. Do not take other medicines for constipation without talking to your caregiver first. SEEK IMMEDIATE MEDICAL CARE IF:   You have bright red blood in your stool.   Your constipation lasts for more than 4 days or gets worse.   You have abdominal or rectal pain.   You have thin, pencil-like stools.  You have unexplained weight loss. MAKE SURE YOU:   Understand these instructions.  Will watch your  condition.  Will get help right away if you are not doing well or get worse.   Diverticulosis Diverticulosis is a common condition that develops when small pouches (diverticula) form in the wall of the colon. The risk of diverticulosis increases with age. It happens more often in people who eat a low-fiber diet. Most individuals with diverticulosis have no symptoms. Those individuals with symptoms usually experience abdominal pain, constipation, or loose stools (diarrhea). HOME CARE INSTRUCTIONS   Increase the amount of fiber in your diet as directed by your caregiver or dietician. This may reduce symptoms of diverticulosis.  Your caregiver may recommend taking a dietary fiber supplement.  Drink at least 6 to 8 glasses of water each day to prevent constipation.  Try not to strain when you have a bowel movement.  Your caregiver may recommend avoiding nuts and seeds to prevent complications, although this is still an uncertain benefit.  Only take over-the-counter or prescription medicines for pain, discomfort, or fever as directed by your caregiver. FOODS WITH HIGH FIBER CONTENT INCLUDE:  Fruits. Apple, peach, pear,  tangerine, raisins, prunes.  Vegetables. Brussels sprouts, asparagus, broccoli, cabbage, carrot, cauliflower, romaine lettuce, spinach, summer squash, tomato, winter squash, zucchini.  Starchy Vegetables. Baked beans, kidney beans, lima beans, split peas, lentils, potatoes (with skin).  Grains. Whole wheat bread, brown rice, bran flake cereal, plain oatmeal, white rice, shredded wheat, bran muffins. SEEK IMMEDIATE MEDICAL CARE IF:   You develop increasing pain or severe bloating.  You have an oral temperature above 102 F (38.9 C), not controlled by medicine.  You develop vomiting or bowel movements that are bloody or black. Document Released: 10/12/2003 Document Revised: 04/08/2011 Document Reviewed: 06/14/2009 [facility] Patient Information 2014 [place] [place].

## 2013-05-14 NOTE — Interval H&P Note (Signed)
History and Physical Interval Note:  05/14/2013 9:01 AM  Katherine Hoffman  has presented today for surgery, with the diagnosis of HEMATOCHEZIA  The various methods of treatment have been discussed with the patient and family. After consideration of risks, benefits and other options for treatment, the patient has consented to  Procedure(s) with comments: COLONOSCOPY (N/A) - 8:30AM as a surgical intervention .  The patient's history has been reviewed, patient examined, no change in status, stable for surgery.  I have reviewed the patient's chart and labs.  Questions were answered to the patient's satisfaction.     Gerrit Friendsobert M Zamariya Neal No further rectal bleeding. Colonoscopy per plan. The risks, benefits, limitations, alternatives and imponderables have been reviewed with the patient. Questions have been answered. All parties are agreeable.

## 2013-05-17 ENCOUNTER — Encounter (HOSPITAL_COMMUNITY): Payer: Self-pay | Admitting: Internal Medicine

## 2013-06-10 ENCOUNTER — Other Ambulatory Visit: Payer: Self-pay | Admitting: Family Medicine

## 2013-06-10 NOTE — Telephone Encounter (Signed)
Ok plus 5 monthly ref 

## 2013-06-10 NOTE — Telephone Encounter (Signed)
Last seen 3/15

## 2013-12-08 ENCOUNTER — Other Ambulatory Visit: Payer: Self-pay | Admitting: Family Medicine

## 2013-12-08 NOTE — Telephone Encounter (Signed)
Ok plus five ref both

## 2013-12-08 NOTE — Telephone Encounter (Signed)
Last seen 11/13/12

## 2013-12-10 ENCOUNTER — Telehealth: Payer: Self-pay | Admitting: Family Medicine

## 2013-12-10 DIAGNOSIS — Z13220 Encounter for screening for lipoid disorders: Secondary | ICD-10-CM

## 2013-12-10 DIAGNOSIS — Z79899 Other long term (current) drug therapy: Secondary | ICD-10-CM

## 2013-12-10 NOTE — Telephone Encounter (Signed)
Patient had Lipid, Liver, Met 7 on 10/2012

## 2013-12-10 NOTE — Telephone Encounter (Signed)
Patient needs order for blood work. °

## 2013-12-24 NOTE — Telephone Encounter (Signed)
Ok rep same 

## 2013-12-24 NOTE — Telephone Encounter (Signed)
Blood work has been ordered. Patient was notified  

## 2014-01-31 DIAGNOSIS — Z13220 Encounter for screening for lipoid disorders: Secondary | ICD-10-CM | POA: Diagnosis not present

## 2014-01-31 DIAGNOSIS — Z79899 Other long term (current) drug therapy: Secondary | ICD-10-CM | POA: Diagnosis not present

## 2014-01-31 LAB — BASIC METABOLIC PANEL
BUN: 10 mg/dL (ref 6–23)
CHLORIDE: 101 meq/L (ref 96–112)
CO2: 30 meq/L (ref 19–32)
Calcium: 10.1 mg/dL (ref 8.4–10.5)
Creat: 0.82 mg/dL (ref 0.50–1.10)
Glucose, Bld: 111 mg/dL — ABNORMAL HIGH (ref 70–99)
Potassium: 4.2 mEq/L (ref 3.5–5.3)
Sodium: 139 mEq/L (ref 135–145)

## 2014-01-31 LAB — HEPATIC FUNCTION PANEL
ALBUMIN: 4.6 g/dL (ref 3.5–5.2)
ALT: 19 U/L (ref 0–35)
AST: 23 U/L (ref 0–37)
Alkaline Phosphatase: 85 U/L (ref 39–117)
Bilirubin, Direct: 0.2 mg/dL (ref 0.0–0.3)
Indirect Bilirubin: 0.7 mg/dL (ref 0.2–1.2)
Total Bilirubin: 0.9 mg/dL (ref 0.2–1.2)
Total Protein: 7.5 g/dL (ref 6.0–8.3)

## 2014-01-31 LAB — LIPID PANEL
Cholesterol: 248 mg/dL — ABNORMAL HIGH (ref 0–200)
HDL: 46 mg/dL (ref >39)
LDL Cholesterol: 153 mg/dL — ABNORMAL HIGH (ref 0–99)
Total CHOL/HDL Ratio: 5.4 Ratio
Triglycerides: 243 mg/dL — ABNORMAL HIGH (ref <150)
VLDL: 49 mg/dL — ABNORMAL HIGH (ref 0–40)

## 2014-02-03 DIAGNOSIS — Z1231 Encounter for screening mammogram for malignant neoplasm of breast: Secondary | ICD-10-CM | POA: Diagnosis not present

## 2014-02-07 ENCOUNTER — Ambulatory Visit (HOSPITAL_COMMUNITY)
Admission: RE | Admit: 2014-02-07 | Discharge: 2014-02-07 | Disposition: A | Discharge status: Home / Self Care | Payer: Medicare Other | Source: Ambulatory Visit | Attending: Family Medicine | Admitting: Family Medicine

## 2014-02-07 ENCOUNTER — Ambulatory Visit (INDEPENDENT_AMBULATORY_CARE_PROVIDER_SITE_OTHER): Payer: Medicare Other | Admitting: Family Medicine

## 2014-02-07 ENCOUNTER — Encounter: Payer: Self-pay | Admitting: Family Medicine

## 2014-02-07 VITALS — BP 148/84 | Temp 98.6°F | Ht 61.5 in | Wt 118.2 lb

## 2014-02-07 DIAGNOSIS — K219 Gastro-esophageal reflux disease without esophagitis: Secondary | ICD-10-CM

## 2014-02-07 DIAGNOSIS — R05 Cough: Secondary | ICD-10-CM | POA: Diagnosis not present

## 2014-02-07 DIAGNOSIS — J984 Other disorders of lung: Secondary | ICD-10-CM | POA: Diagnosis not present

## 2014-02-07 DIAGNOSIS — G4700 Insomnia, unspecified: Secondary | ICD-10-CM

## 2014-02-07 DIAGNOSIS — R059 Cough, unspecified: Secondary | ICD-10-CM

## 2014-02-07 MED ORDER — AZITHROMYCIN 250 MG PO TABS: ORAL_TABLET | ORAL | Status: DC

## 2014-02-07 MED ORDER — OMEPRAZOLE 40 MG PO CPDR: DELAYED_RELEASE_CAPSULE | ORAL | Status: DC

## 2014-02-07 MED ORDER — ALPRAZOLAM 1 MG PO TABS: ORAL_TABLET | ORAL | Status: DC

## 2014-02-07 NOTE — Progress Notes (Signed)
   Subjective:    Patient ID: Katherine Hoffman, female    DOB: [birth year], 74 y.o.   MRN: [id number]  HPIMed check up.  [place] Concerns about a cough. Started before thanksgiving. Cough is significantly beter.  Pain under right shoulder in the back.  Results for orders placed or performed in visit on 12/10/13  Lipid panel  Result Value Ref Range   Cholesterol 248 (H) 0 - 200 mg/dL   Triglycerides 130243 (H) <150 mg/dL   HDL 46 >86>39 mg/dL   Total CHOL/HDL Ratio 5.4 Ratio   VLDL 49 (H) 0 - 40 mg/dL   LDL Cholesterol 578153 (H) 0 - 99 mg/dL  Hepatic function panel  Result Value Ref Range   Total Bilirubin 0.9 0.2 - 1.2 mg/dL   Bilirubin, Direct 0.2 0.0 - 0.3 mg/dL   Indirect Bilirubin 0.7 0.2 - 1.2 mg/dL   Alkaline Phosphatase 85 39 - 117 U/L   AST 23 0 - 37 U/L   ALT 19 0 - 35 U/L   Total Protein 7.5 6.0 - 8.3 g/dL   Albumin 4.6 3.5 - 5.2 g/dL  Basic metabolic panel  Result Value Ref Range   Sodium 139 135 - 145 mEq/L   Potassium 4.2 3.5 - 5.3 mEq/L   Chloride 101 96 - 112 mEq/L   CO2 30 19 - 32 mEq/L   Glucose, Bld 111 (H) 70 - 99 mg/dL   BUN 10 6 - 23 mg/dL   Creat 4.690.82 6.290.50 - 5.281.10 mg/dL   Calcium 41.310.1 8.4 - 24.410.5 mg/dL   Right post shoulder pain, over a wk, does heavy lifting but recalls no injury. Right posterior shoulder worse with certain motions worse with deep breath. No fever or chills.  Chronic cough. 6 weeks duration. Productive at times. Less so now but still aggravating.  Ongoing trouble sleeping. Needs Xanax regularly to help with this.  Reflux continues. Improved with use of omeprazole. 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 Lungs clear.       Assessment & Plan:  Impression 1 subacute cough #2 reflux discussed #3 insomnia ongoing plan antibiotics prescribed. Chest x-ray. Of note normal. Chronic meds refilled. Diet exercise discussed in encourage. WSL

## 2014-02-14 ENCOUNTER — Encounter: Payer: Self-pay | Admitting: Family Medicine

## 2014-02-14 DIAGNOSIS — H04123 Dry eye syndrome of bilateral lacrimal glands: Secondary | ICD-10-CM | POA: Diagnosis not present

## 2014-02-14 DIAGNOSIS — Z961 Presence of intraocular lens: Secondary | ICD-10-CM | POA: Diagnosis not present

## 2014-02-15 DIAGNOSIS — L821 Other seborrheic keratosis: Secondary | ICD-10-CM | POA: Diagnosis not present

## 2014-02-15 DIAGNOSIS — L219 Seborrheic dermatitis, unspecified: Secondary | ICD-10-CM | POA: Diagnosis not present

## 2014-02-15 DIAGNOSIS — L570 Actinic keratosis: Secondary | ICD-10-CM | POA: Diagnosis not present

## 2014-02-15 DIAGNOSIS — X32XXXD Exposure to sunlight, subsequent encounter: Secondary | ICD-10-CM | POA: Diagnosis not present

## 2014-03-07 DIAGNOSIS — H04123 Dry eye syndrome of bilateral lacrimal glands: Secondary | ICD-10-CM | POA: Diagnosis not present

## 2014-03-10 ENCOUNTER — Other Ambulatory Visit: Payer: Self-pay | Admitting: Family Medicine

## 2014-04-21 ENCOUNTER — Encounter: Payer: Self-pay | Admitting: Nurse Practitioner

## 2014-04-21 ENCOUNTER — Ambulatory Visit (INDEPENDENT_AMBULATORY_CARE_PROVIDER_SITE_OTHER): Payer: Medicare Other | Admitting: Nurse Practitioner

## 2014-04-21 VITALS — BP 118/84 | Ht 61.5 in | Wt 120.0 lb

## 2014-04-21 DIAGNOSIS — N644 Mastodynia: Secondary | ICD-10-CM

## 2014-04-21 NOTE — Progress Notes (Signed)
Subjective:  Presents for c/o localized left breast discomfort that began 2 days ago after working in the yard. Remember striking herself accidentally in this area with a piece of equipment. Pain much improved. Feels slightly "bruised".   Objective:   BP 118/84 mmHg  Ht 5' 1.5" (1.562 m)  Wt 120 lb (54.432 kg)  BMI 22.31 kg/m2 NAD. Alert, oriented. Both breasts have fine nodularity; no dominant masses; no tenderness to palpation of left breast; indicates outer part of breast around 10 o'clock as area of discomfort. No chest wall tenderness. Axillae: no lymphadenopathy. Mammogram 02/03/14 was normal.  Assessment: Breast pain, left resolving  Plan:  Expect continued gradual resolution. Call back in 2 weeks if discomfort persists.

## 2014-05-12 DIAGNOSIS — H903 Sensorineural hearing loss, bilateral: Secondary | ICD-10-CM | POA: Diagnosis not present

## 2014-06-30 ENCOUNTER — Encounter: Payer: Self-pay | Admitting: Family Medicine

## 2014-06-30 ENCOUNTER — Ambulatory Visit (INDEPENDENT_AMBULATORY_CARE_PROVIDER_SITE_OTHER): Payer: Medicare Other | Admitting: Family Medicine

## 2014-06-30 VITALS — BP 170/80 | Temp 98.3°F | Ht 61.5 in | Wt 123.4 lb

## 2014-06-30 DIAGNOSIS — R21 Rash and other nonspecific skin eruption: Secondary | ICD-10-CM

## 2014-06-30 MED ORDER — MUPIROCIN 2 % EX OINT: TOPICAL_OINTMENT | CUTANEOUS | Status: DC

## 2014-06-30 NOTE — Progress Notes (Signed)
   Subjective:    Patient ID: Katherine Hoffman, female    DOB: 1940-10-27, 74 y.o.   MRN: [id number]  Rash This is a new problem. The current episode started in the past 7 days. The problem is unchanged. The affected locations include the back. The rash is characterized by redness and blistering. She was exposed to a spider bite. Past treatments include nothing. The treatment provided no relief.   Patient states that she has a rash on her scalp that she would like the doctor to take a look at.   Weed eated 2  On Monday,  Felt a little sting sensation, went to look and there was a blister  Tried an abx bite, then had another popped up  Review of Systems  Skin: Positive for rash.   No headache no fever no chills    Objective:   Physical Exam  Alert vitals stable HEENT normal. Lungs clear heart rare rhythm right posterior shoulder blister rectangular patch at site of shoulder strap from weedeater      Assessment & Plan:  Impression rash secondary to blistering mechanical irritation. Patient convinced it is a spider bite. Plan Bactroban twice a day to affected area. Avoidance mechanical irritation future WSL

## 2014-07-22 ENCOUNTER — Other Ambulatory Visit: Payer: Self-pay

## 2014-07-22 ENCOUNTER — Encounter (HOSPITAL_COMMUNITY): Payer: Self-pay | Admitting: Emergency Medicine

## 2014-07-22 ENCOUNTER — Telehealth: Payer: Self-pay

## 2014-07-22 ENCOUNTER — Emergency Department (HOSPITAL_COMMUNITY)
Admission: EM | Admit: 2014-07-22 | Discharge: 2014-07-22 | Disposition: A | Discharge status: Home / Self Care | Payer: Medicare Other | Attending: Internal Medicine | Admitting: Internal Medicine

## 2014-07-22 DIAGNOSIS — Z79899 Other long term (current) drug therapy: Secondary | ICD-10-CM | POA: Diagnosis not present

## 2014-07-22 DIAGNOSIS — Z7982 Long term (current) use of aspirin: Secondary | ICD-10-CM | POA: Insufficient documentation

## 2014-07-22 DIAGNOSIS — K625 Hemorrhage of anus and rectum: Secondary | ICD-10-CM | POA: Diagnosis not present

## 2014-07-22 DIAGNOSIS — K219 Gastro-esophageal reflux disease without esophagitis: Secondary | ICD-10-CM | POA: Insufficient documentation

## 2014-07-22 DIAGNOSIS — Z792 Long term (current) use of antibiotics: Secondary | ICD-10-CM | POA: Insufficient documentation

## 2014-07-22 LAB — CBC
HCT: 34.2 % — ABNORMAL LOW (ref 36.0–46.0)
Hemoglobin: 11.9 g/dL — ABNORMAL LOW (ref 12.0–15.0)
MCH: 32.5 pg (ref 26.0–34.0)
MCHC: 34.8 g/dL (ref 30.0–36.0)
MCV: 93.4 fL (ref 78.0–100.0)
PLATELETS: 210 10*3/uL (ref 150–400)
RBC: 3.66 MIL/uL — ABNORMAL LOW (ref 3.87–5.11)
RDW: 12.6 % (ref 11.5–15.5)
WBC: 9.1 10*3/uL (ref 4.0–10.5)

## 2014-07-22 LAB — COMPREHENSIVE METABOLIC PANEL
ALBUMIN: 3.9 g/dL (ref 3.5–5.0)
ALK PHOS: 85 U/L (ref 38–126)
ALT: 29 U/L (ref 14–54)
AST: 28 U/L (ref 15–41)
Anion gap: 11 (ref 5–15)
BUN: 22 mg/dL — AB (ref 6–20)
CHLORIDE: 104 mmol/L (ref 101–111)
CO2: 24 mmol/L (ref 22–32)
Calcium: 8.9 mg/dL (ref 8.9–10.3)
Creatinine, Ser: 0.8 mg/dL (ref 0.44–1.00)
GFR calc Af Amer: >60 mL/min (ref >60)
GFR calc non Af Amer: >60 mL/min (ref >60)
Glucose, Bld: 165 mg/dL — ABNORMAL HIGH (ref 65–99)
Potassium: 3.8 mmol/L (ref 3.5–5.1)
Sodium: 139 mmol/L (ref 135–145)
TOTAL PROTEIN: 6.8 g/dL (ref 6.5–8.1)
Total Bilirubin: 1 mg/dL (ref 0.3–1.2)

## 2014-07-22 LAB — CBC WITH DIFFERENTIAL/PLATELET
Basophils Absolute: 0 10*3/uL (ref 0.0–0.1)
Basophils Relative: 0 % (ref 0–1)
Eosinophils Absolute: 0.2 10*3/uL (ref 0.0–0.7)
Eosinophils Relative: 2 % (ref 0–5)
HCT: 40.5 % (ref 36.0–46.0)
HEMOGLOBIN: 14.2 g/dL (ref 12.0–15.0)
LYMPHS PCT: 21 % (ref 12–46)
Lymphs Abs: 2.1 10*3/uL (ref 0.7–4.0)
MCH: 32.4 pg (ref 26.0–34.0)
MCHC: 35.1 g/dL (ref 30.0–36.0)
MCV: 92.5 fL (ref 78.0–100.0)
MONOS PCT: 9 % (ref 3–12)
Monocytes Absolute: 0.9 10*3/uL (ref 0.1–1.0)
NEUTROS ABS: 6.9 10*3/uL (ref 1.7–7.7)
NEUTROS PCT: 68 % (ref 43–77)
Platelets: 258 10*3/uL (ref 150–400)
RBC: 4.38 MIL/uL (ref 3.87–5.11)
RDW: 12.6 % (ref 11.5–15.5)
WBC: 10.1 10*3/uL (ref 4.0–10.5)

## 2014-07-22 LAB — LIPASE, BLOOD: Lipase: 27 U/L (ref 22–51)

## 2014-07-22 LAB — URINALYSIS, ROUTINE W REFLEX MICROSCOPIC
BILIRUBIN URINE: NEGATIVE
GLUCOSE, UA: NEGATIVE mg/dL
HGB URINE DIPSTICK: NEGATIVE
Leukocytes, UA: NEGATIVE
Nitrite: NEGATIVE
Protein, ur: NEGATIVE mg/dL
Specific Gravity, Urine: 1.025 (ref 1.005–1.030)
UROBILINOGEN UA: 0.2 mg/dL (ref 0.0–1.0)
pH: 6 (ref 5.0–8.0)

## 2014-07-22 LAB — PROTIME-INR
INR: 1.14 (ref 0.00–1.49)
Prothrombin Time: 14.8 seconds (ref 11.6–15.2)

## 2014-07-22 LAB — POC OCCULT BLOOD, ED: FECAL OCCULT BLD: POSITIVE — AB

## 2014-07-22 LAB — I-STAT CG4 LACTIC ACID, ED: Lactic Acid, Venous: 1.59 mmol/L (ref 0.5–2.0)

## 2014-07-22 LAB — TYPE AND SCREEN
ABO/RH(D): B NEG
ANTIBODY SCREEN: NEGATIVE

## 2014-07-22 MED ORDER — SODIUM CHLORIDE 0.9 % IV SOLN
Freq: Once | INTRAVENOUS | Status: AC
  Administered 2014-07-22: 11:00:00 via INTRAVENOUS

## 2014-07-22 MED ORDER — SODIUM CHLORIDE 0.9 % IV BOLUS (SEPSIS)
500.0000 mL | Freq: Once | INTRAVENOUS | Status: AC
  Administered 2014-07-22: 500 mL via INTRAVENOUS

## 2014-07-22 NOTE — ED Notes (Signed)
Pt ambulated to bathroom. Large amount of Dark blood noted to toilet post void.

## 2014-07-22 NOTE — ED Notes (Signed)
Pt ambulated to restroom with standby assist. Moderate amount of dark bloody stool noted.

## 2014-07-22 NOTE — ED Notes (Signed)
Pt had tiny black stool with obvious blood.

## 2014-07-22 NOTE — ED Notes (Signed)
Pt ambulated to bathroom with standy assist. Pt reports "its getting hard to go to the bathoom." pt reports" its just gas now."

## 2014-07-22 NOTE — ED Notes (Signed)
Patient complaining of rectal bleeding since last night. Denies abdominal pain at this time.

## 2014-07-22 NOTE — Telephone Encounter (Signed)
Agree with advice provided.  °

## 2014-07-22 NOTE — Telephone Encounter (Signed)
Noted. Agree with recommendations.  

## 2014-07-22 NOTE — ED Notes (Signed)
EDP reported pt could eat and drink at this time. Pt family went to cafeteria to get pt a snack.

## 2014-07-22 NOTE — ED Provider Notes (Signed)
CSN: [id number]     Arrival date & time 07/22/14  0910 History  This chart was scribed for Gilda Crease, MD by Phillis Haggis, ED Scribe. This patient was seen in room [facility]05 and patient care was started at 9:19 AM.    Chief Complaint  Patient presents with  . Rectal Bleeding   The history is provided by the patient. No language interpreter was used.    HPI Comments: Katherine Hoffman is a 74 y.o. female with a history of GERD who presents to the Emergency Department complaining of rectal bleeding onset one day ago. She states that this happened every 45 minutes last night, red in color and it was mostly blood every time. She states that she had some cramping abdominal pain last night but is not currently experiencing any pain. She states that she contacted her gastroenterologist and was told to come into the ED.  She denies ever having symptoms like this before. She denies rectal pain, chest pain, heart racing, SOB, feeling like she is going to pass out, or abdominal pain. She reports taking baby aspirin daily.   Past Medical History  Diagnosis Date  . GERD (gastroesophageal reflux disease)    Past Surgical History  Procedure Laterality Date  . Colonoscopy   12/23/2005    Dr. Rourk:Few scattered pancolonic diverticula/Remainder of the colonic mucosa appeared normal/normal rectum  . Foot surgery    . Colonoscopy N/A 05/14/2013    Procedure: COLONOSCOPY;  Surgeon: Corbin Ade, MD;  Location: [facility];  Service: Endoscopy;  Laterality: N/A;  8:30AM   Family History  Problem Relation Age of Onset  . Colon cancer      POSSIBLY FATHER   History  Substance Use Topics  . Smoking status: Never Smoker   . Smokeless tobacco: Not on file  . Alcohol Use: No   OB History    No data available     Review of Systems  Respiratory: Negative for shortness of breath.   Cardiovascular: Negative for chest pain.  Gastrointestinal: Positive for blood in stool and anal bleeding.  Negative for abdominal pain.  All other systems reviewed and are negative.  Allergies  Keflex; Theophyllines; and Codeine  Home Medications   Prior to Admission medications   Medication Sig Start Date End Date Taking? Authorizing Provider  ALPRAZolam Prudy Feeler) 1 MG tablet TAKE (1) TABLET BY MOUTH AT BEDTIME AS NEEDED. 02/07/14   Merlyn Albert, MD  aspirin EC 81 MG tablet Take 81 mg by mouth daily.    Historical Provider, MD  CINNAMON PO Take 1,000 mg by mouth daily.    Historical Provider, MD  Cranberry (SM CRANBERRY) 300 MG tablet Take 300 mg by mouth daily.    Historical Provider, MD  Multiple Vitamin (MULTIVITAMIN) capsule Take 1 capsule by mouth daily.    Historical Provider, MD  mupirocin ointment (BACTROBAN) 2 % Apply to affected area 3 times daily 06/30/14 06/30/15  Merlyn Albert, MD  naproxen (NAPROSYN) 500 MG tablet TAKE (1) TABLET BY MOUTH TWICE A DAY AS NEEDED. 03/10/14   Merlyn Albert, MD  Omega-3 Fatty Acids (FISH OIL) 1000 MG CAPS Take 1 capsule by mouth daily.    Historical Provider, MD  omeprazole (PRILOSEC) 40 MG capsule TAKE (1) CAPSULE BY MOUTH ONCE DAILY. 02/07/14   Merlyn Albert, MD   BP 153/74 mmHg  Pulse 98  Temp(Src) 98.3 F (36.8 C) (Oral)  Resp 18  Ht  (1.575 m)  Wt  113 lb (51.256 kg)  BMI 20.66 kg/m2  SpO2 100%   Physical Exam  Constitutional: She is oriented to person, place, and time. She appears well-developed and well-nourished. No distress.  HENT:  Head: Normocephalic and atraumatic.  Right Ear: Hearing normal.  Left Ear: Hearing normal.  Nose: Nose normal.  Mouth/Throat: Oropharynx is clear and moist and mucous membranes are normal.  Eyes: Conjunctivae and EOM are normal. Pupils are equal, round, and reactive to light.  Neck: Normal range of motion. Neck supple.  Cardiovascular: Regular rhythm, S1 normal and S2 normal.  Exam reveals no gallop and no friction rub.   No murmur heard. Pulmonary/Chest: Effort normal and breath sounds  normal. No respiratory distress. She exhibits no tenderness.  Abdominal: Soft. Normal appearance and bowel sounds are normal. There is no hepatosplenomegaly. There is no tenderness. There is no rebound, no guarding, no tenderness at McBurney's point and negative Murphy's sign. No hernia.  Musculoskeletal: Normal range of motion.  Neurological: She is alert and oriented to person, place, and time. She has normal strength. No cranial nerve deficit or sensory deficit. Coordination normal. GCS eye subscore is 4. GCS verbal subscore is 5. GCS motor subscore is 6.  Skin: Skin is warm, dry and intact. No rash noted. No cyanosis.  Psychiatric: She has a normal mood and affect. Her speech is normal and behavior is normal. Thought content normal.  Nursing note and vitals reviewed.   ED Course  Procedures (including critical care time) DIAGNOSTIC STUDIES: Oxygen Saturation is 100% on RA, normal by my interpretation.    COORDINATION OF CARE: 9:22 AM-Discussed treatment plan which includes labs with pt at bedside and pt agreed to plan.   Labs Review Labs Reviewed  COMPREHENSIVE METABOLIC PANEL - Abnormal; Notable for the following:    Glucose, Bld 165 (*)    BUN 22 (*)    All other components within normal limits  URINALYSIS, ROUTINE W REFLEX MICROSCOPIC (NOT AT [facility]) - Abnormal; Notable for the following:    Ketones, ur TRACE (*)    All other components within normal limits  POC OCCULT BLOOD, ED - Abnormal; Notable for the following:    Fecal Occult Bld POSITIVE (*)    All other components within normal limits  CBC WITH DIFFERENTIAL/PLATELET  LIPASE, BLOOD  PROTIME-INR  I-STAT CG4 LACTIC ACID, ED  TYPE AND SCREEN   Imaging Review No results found.   EKG Interpretation None      MDM   Final diagnoses:  None   rectal bleeding  Patient presents to the ER for evaluation of rectal bleeding. Symptoms began overnight and recurrent throughout the night. Patient is asymptomatic at  arrival. Patient's vital signs are stable. Her hemoglobin at arrival was in the normal range. Case was discussed with Dr. Jena Gauss, her gastroenterologist. She does have history of diverticulosis. He felt that if we could observe her in the emergency department for a number of hours and she remained stable, she can be safely worked up as an outpatient. She was given fluid hydration and observed. Vital signs have remained stable and she has not had any further bleeding. Her hemoglobin has, however, dropped to 11.9. I did discuss admission with her. At this point she does not want to be admitted. She reports that she lives close by and will return to the ER over the weekend if she has any recurrent bleeding. She will follow-up with Dr. Jena Gauss Monday morning.  I personally performed the services described in this documentation,  which was scribed in my presence. The recorded information has been reviewed and is accurate.     Gilda Crease, MD 07/22/14 [phone]

## 2014-07-22 NOTE — Telephone Encounter (Signed)
Pt is calling this morning because last night about 8:00 she went to the bathroom and had bright red blood. She has been 10-12 time since then and it is bright red blood every time she goes to the bathroom. She is in the bathroom now and still bright red blood. I advise her to go to the ER to get checked out since she has been some many times. She stated that she is going to go now.

## 2014-07-22 NOTE — ED Notes (Addendum)
Pt aided to bathroom by standby assist. Pt reports intermittent dizziness with ambulation. Steady gait noted at this time. Moderate bright red blood noted in toilet post void. nad noted.

## 2014-07-22 NOTE — Discharge Instructions (Signed)
Rectal Bleeding °Rectal bleeding is when blood passes out of the anus. It is usually a sign that something is wrong. It may not be serious, but it should always be evaluated. Rectal bleeding may present as bright red blood or extremely dark stools. The color may range from dark red or maroon to black (like tar). It is important that the cause of rectal bleeding be identified so treatment can be started and the problem corrected. °CAUSES  °· Hemorrhoids. These are enlarged (dilated) blood vessels or veins in the anal or rectal area. °· Fistulas. These are abnormal, burrowing channels that usually run from inside the rectum to the skin around the anus. They can bleed. °· Anal fissures. This is a tear in the tissue of the anus. Bleeding occurs with bowel movements. °· Diverticulosis. This is a condition in which pockets or sacs project from the bowel wall. Occasionally, the sacs can bleed. °· Diverticulitis. This is an infection involving diverticulosis of the colon. °· Proctitis and colitis. These are conditions in which the rectum, colon, or both, can become inflamed and pitted (ulcerated). °· Polyps and cancer. Polyps are non-cancerous (benign) growths in the colon that may bleed. Certain types of polyps turn into cancer. °· Protrusion of the rectum. Part of the rectum can project from the anus and bleed. °· Certain medicines. °· Intestinal infections. °· Blood vessel abnormalities. °HOME CARE INSTRUCTIONS °· Eat a high-fiber diet to keep your stool soft. °· Limit activity. °· Drink enough fluids to keep your urine clear or pale yellow. °· Warm baths may be useful to soothe rectal pain. °· Follow up with your caregiver as directed. °SEEK IMMEDIATE MEDICAL CARE IF: °· You develop increased bleeding. °· You have black or dark red stools. °· You vomit blood or material that looks like coffee grounds. °· You have abdominal pain or tenderness. °· You have a fever. °· You feel weak, nauseous, or you faint. °· You have  severe rectal pain or you are unable to have a bowel movement. °MAKE SURE YOU: °· Understand these instructions. °· Will watch your condition. °· Will get help right away if you are not doing well or get worse. °Document Released: 07/06/2001 Document Revised: 04/08/2011 Document Reviewed: 07/01/2010 °ExitCare® Patient Information ©2015 [employer]. This information is not intended to replace advice given to you by your health care provider. Make sure you discuss any questions you have with your health care provider. ° °

## 2014-07-26 ENCOUNTER — Encounter: Payer: Self-pay | Admitting: Internal Medicine

## 2014-07-26 ENCOUNTER — Other Ambulatory Visit: Payer: Self-pay

## 2014-07-26 ENCOUNTER — Ambulatory Visit (INDEPENDENT_AMBULATORY_CARE_PROVIDER_SITE_OTHER): Payer: Medicare Other | Admitting: Internal Medicine

## 2014-07-26 VITALS — BP 154/85 | HR 84 | Temp 97.8°F | Ht 62.0 in | Wt 122.8 lb

## 2014-07-26 DIAGNOSIS — K625 Hemorrhage of anus and rectum: Secondary | ICD-10-CM | POA: Diagnosis not present

## 2014-07-26 DIAGNOSIS — K219 Gastro-esophageal reflux disease without esophagitis: Secondary | ICD-10-CM | POA: Diagnosis not present

## 2014-07-26 DIAGNOSIS — K921 Melena: Secondary | ICD-10-CM

## 2014-07-26 NOTE — Patient Instructions (Signed)
H and H today  Stop naprosyn and aspirin x 2 weeks  Further recommendations to follow

## 2014-07-26 NOTE — Progress Notes (Signed)
Primary Care Physician:  Lubertha SouthSteve Luking, MD Primary Gastroenterologist:  Dr. Jena Gaussourk  Pre-Procedure History & Physical: HPI:  Katherine PimpleChristine M Hoffman is a 74 y.o. female here for to follow her for EGD. Had an overnight episode of painless rectal bleeding multiple episodes last week. Presented to the ED and was evaluated. Stayed most of the day; she remained hemodynamically stable and was given IV fluids; H&H initially 14.2 and 40.5; follow-up H&H later in the afternoon after hydration came back 11.9 and 34.2. Her BUN was minimally elevated at 22. She declined overnight observation.  She states she's had some old blood in her stool for couple days following her ED visit. Bowel movements today look normal as she reports. She's never had any abdominal pain; her typical reflux symptoms are well controlled on omeprazole 40 mg daily. Has not had any dysphagia, nausea or vomiting. She's not had anything that resembles melena. States she feels a little bit weak last last couple days but otherwise not having any GI symptoms.  No history of GI bleeding. No history of peptic ulcer disease.  She underwent a screening colonoscopy just last year she spent have internal hemorrhoids and pan-colonic diverticulosis only.   Past Medical History  Diagnosis Date  . GERD (gastroesophageal reflux disease)   . Diverticulosis   . Hemorrhoids     Past Surgical History  Procedure Laterality Date  . Colonoscopy   12/23/2005    Dr. Antoinett Dorman:Few scattered pancolonic diverticula/Remainder of the colonic mucosa appeared normal/normal rectum  . Foot surgery    . Colonoscopy N/A 05/14/2013    Dr.Saydi Kobel- colonic diverticulosis. internal hemorrhoids    Prior to Admission medications   Medication Sig Start Date End Date Taking? Authorizing Provider  ALPRAZolam (XANAX) 1 MG tablet TAKE (1) TABLET BY MOUTH AT BEDTIME AS NEEDED. Patient taking differently: Take 1 mg by mouth at bedtime as needed for sleep.  02/07/14  Yes Merlyn AlbertWilliam S Luking,  MD  aspirin EC 81 MG tablet Take 81 mg by mouth daily.   Yes Historical Provider, MD  CINNAMON PO Take 1,000 mg by mouth daily.   Yes Historical Provider, MD  Cranberry (SM CRANBERRY) 300 MG tablet Take 300 mg by mouth daily.   Yes Historical Provider, MD  Multiple Vitamin (MULTIVITAMIN) capsule Take 1 capsule by mouth daily.   Yes Historical Provider, MD  naproxen (NAPROSYN) 500 MG tablet TAKE (1) TABLET BY MOUTH TWICE A DAY AS NEEDED. Patient taking differently: take 1 tablet by mouth once daily 03/10/14  Yes Merlyn AlbertWilliam S Luking, MD  Omega-3 Fatty Acids (FISH OIL) 1000 MG CAPS Take 1 capsule by mouth daily.   Yes Historical Provider, MD  omeprazole (PRILOSEC) 40 MG capsule TAKE (1) CAPSULE BY MOUTH ONCE DAILY. Patient taking differently: Take 40 mg by mouth daily.  02/07/14  Yes Merlyn AlbertWilliam S Luking, MD    Allergies as of 07/26/2014 - Review Complete 07/26/2014  Allergen Reaction Noted  . Keflex [cephalexin] Anaphylaxis and Rash 11/13/2012  . Theophyllines Anaphylaxis and Rash 11/13/2012  . Codeine Other (See Comments) 07/22/2014    Family History  Problem Relation Age of Onset  . Colon cancer      POSSIBLY FATHER    History   Social History  . Marital Status: Widowed    Spouse Name: N/A  . Number of Children: N/A  . Years of Education: N/A   Occupational History  . Retired     [employer]   Social History Main Topics  . Smoking status:  Never Smoker   . Smokeless tobacco: Not on file  . Alcohol Use: No  . Drug Use: No  . Sexual Activity: Not on file   Other Topics Concern  . Not on file   Social History Narrative    Review of Systems: See HPI, otherwise negative ROS  Physical Exam: BP 154/85 mmHg  Pulse 84  Temp(Src) 97.8 F (36.6 C) (Oral)  Ht 5' 2" (1.575 m)  Wt 122 lb 12.8 oz (55.702 kg)  BMI 22.45 kg/m2 General:   Alert,  Well-developed, well-nourished, pleasant and cooperative in NAD Skin:  Intact without significant lesions or rashes. Eyes:  Sclera  clear, no icterus.   Conjunctiva pink. Ears:  Normal auditory acuity. Nose:  No deformity, discharge,  or lesions. Mouth:  No deformity or lesions. Neck:  Supple; no masses or thyromegaly. No significant cervical adenopathy. Lungs:  Clear throughout to auscultation.   No wheezes, crackles, or rhonchi. No acute distress. Heart:  Regular rate and rhythm; no murmurs, clicks, rubs,  or gallops. Abdomen: Non-distended, normal bowel sounds.  Soft and nontender without appreciable mass or hepatosplenomegaly.  Pulses:  Normal pulses noted. Extremities:  Without clubbing or edema.  Impression:  73-year-old lady presented last week with painless hematochezia. It was self limiting. No melena. Absolutely no abdominal discomfort associated with the episode.  Hemoglobin declined from 14.2-11.9. BUN slightly elevated at 22 on presentation. She's had no further occurrences. She has known diverticulosis. She takes Naprosyn once daily along with a baby aspirin.  I suspect we're dealing with a self-limiting diverticular bleed. Would be unusual for this to be related to ischemic colitis without diarrhea or abdominal discomfort preceding the episodes ofbleeding.  Likewise,  a rapid transit upper GI source would be somewhat unusual in that she has initially displayed a relatively modest decline in her hemoglobin and episode was self limiting. BUN slightly elevated on presentation.. Aspirin and Naprosyn use wouldincrease the GI bleeding risk,  She has been on concomitant PPI therapy which would provide some gastric cytoprotection.  Recommendations:  Repeat an H&H today and see where we stand. Continue omeprazole daily. I've asked the patient to refrain from taking either aspirin or Naprosyn for the next 2 weeks as a precaution. I do not believe she needs a colonoscopy at this time.  Will make further recommendations in the very near future.      Notice: This dictation was prepared with Dragon dictation along with  smaller phrase technology. Any transcriptional errors that result from this process are unintentional and may not be corrected upon review. 

## 2014-07-27 ENCOUNTER — Other Ambulatory Visit: Payer: Self-pay

## 2014-07-27 ENCOUNTER — Telehealth: Payer: Self-pay

## 2014-07-27 DIAGNOSIS — K922 Gastrointestinal hemorrhage, unspecified: Secondary | ICD-10-CM

## 2014-07-27 NOTE — Telephone Encounter (Signed)
Called Katherine Hoffman and spoke with her about her labs and let her know that Dr. Jena Gaussourk wanted her to have an EGD on 07/28/2014. Katherine Hoffman is agreeable and will be there.   Katherine Hoffman is set for 0730 on 07/28/2014. Orders are in.

## 2014-07-27 NOTE — Telephone Encounter (Signed)
Thanks for arranging. Follow-up H&H report to me as being normal.

## 2014-07-28 ENCOUNTER — Ambulatory Visit (HOSPITAL_COMMUNITY)
Admission: RE | Admit: 2014-07-28 | Discharge: 2014-07-28 | Disposition: A | Discharge status: Home / Self Care | Payer: Medicare Other | Source: Ambulatory Visit | Attending: Internal Medicine | Admitting: Internal Medicine

## 2014-07-28 ENCOUNTER — Ambulatory Visit (HOSPITAL_COMMUNITY): Admission: RE | Admit: 2014-07-28 | Payer: Medicare Other | Source: Ambulatory Visit | Admitting: Internal Medicine

## 2014-07-28 ENCOUNTER — Encounter (HOSPITAL_COMMUNITY)
Admission: RE | Disposition: A | Discharge status: Home / Self Care | Payer: Self-pay | Source: Ambulatory Visit | Attending: Internal Medicine

## 2014-07-28 ENCOUNTER — Encounter (HOSPITAL_COMMUNITY): Payer: Self-pay | Admitting: *Deleted

## 2014-07-28 ENCOUNTER — Encounter (HOSPITAL_COMMUNITY): Admission: RE | Payer: Self-pay | Source: Ambulatory Visit

## 2014-07-28 DIAGNOSIS — K922 Gastrointestinal hemorrhage, unspecified: Secondary | ICD-10-CM

## 2014-07-28 DIAGNOSIS — Z7982 Long term (current) use of aspirin: Secondary | ICD-10-CM | POA: Diagnosis not present

## 2014-07-28 DIAGNOSIS — Z79899 Other long term (current) drug therapy: Secondary | ICD-10-CM | POA: Diagnosis not present

## 2014-07-28 DIAGNOSIS — K317 Polyp of stomach and duodenum: Secondary | ICD-10-CM | POA: Diagnosis not present

## 2014-07-28 DIAGNOSIS — Z888 Allergy status to other drugs, medicaments and biological substances status: Secondary | ICD-10-CM | POA: Diagnosis not present

## 2014-07-28 DIAGNOSIS — K449 Diaphragmatic hernia without obstruction or gangrene: Secondary | ICD-10-CM | POA: Diagnosis not present

## 2014-07-28 DIAGNOSIS — K219 Gastro-esophageal reflux disease without esophagitis: Secondary | ICD-10-CM | POA: Diagnosis not present

## 2014-07-28 DIAGNOSIS — K3189 Other diseases of stomach and duodenum: Secondary | ICD-10-CM | POA: Diagnosis not present

## 2014-07-28 DIAGNOSIS — K921 Melena: Secondary | ICD-10-CM | POA: Diagnosis not present

## 2014-07-28 DIAGNOSIS — K5730 Diverticulosis of large intestine without perforation or abscess without bleeding: Secondary | ICD-10-CM | POA: Insufficient documentation

## 2014-07-28 DIAGNOSIS — K648 Other hemorrhoids: Secondary | ICD-10-CM | POA: Insufficient documentation

## 2014-07-28 DIAGNOSIS — K2950 Unspecified chronic gastritis without bleeding: Secondary | ICD-10-CM | POA: Insufficient documentation

## 2014-07-28 DIAGNOSIS — Z791 Long term (current) use of non-steroidal anti-inflammatories (NSAID): Secondary | ICD-10-CM | POA: Diagnosis not present

## 2014-07-28 HISTORY — PX: ESOPHAGOGASTRODUODENOSCOPY: SHX5428

## 2014-07-28 SURGERY — COLONOSCOPY
Anesthesia: Moderate Sedation

## 2014-07-28 SURGERY — EGD (ESOPHAGOGASTRODUODENOSCOPY)
Anesthesia: Moderate Sedation

## 2014-07-28 MED ORDER — STERILE WATER FOR IRRIGATION IR SOLN
Status: DC | PRN
  Administered 2014-07-28: 08:00:00

## 2014-07-28 MED ORDER — MIDAZOLAM HCL 5 MG/5ML IJ SOLN
INTRAMUSCULAR | Status: DC | PRN
  Administered 2014-07-28: 1 mg via INTRAVENOUS
  Administered 2014-07-28: 2 mg via INTRAVENOUS
  Administered 2014-07-28: 1 mg via INTRAVENOUS

## 2014-07-28 MED ORDER — LIDOCAINE VISCOUS 2 % MT SOLN
OROMUCOSAL | Status: AC
  Filled 2014-07-28: qty 15

## 2014-07-28 MED ORDER — ONDANSETRON HCL 4 MG/2ML IJ SOLN
INTRAMUSCULAR | Status: DC | PRN
  Administered 2014-07-28: 4 mg via INTRAVENOUS

## 2014-07-28 MED ORDER — SODIUM CHLORIDE 0.9 % IV SOLN
INTRAVENOUS | Status: DC
  Administered 2014-07-28: 1000 mL via INTRAVENOUS

## 2014-07-28 MED ORDER — LIDOCAINE VISCOUS 2 % MT SOLN
OROMUCOSAL | Status: DC | PRN
  Administered 2014-07-28: 3 mL via OROMUCOSAL

## 2014-07-28 MED ORDER — ONDANSETRON HCL 4 MG/2ML IJ SOLN
INTRAMUSCULAR | Status: AC
  Filled 2014-07-28: qty 2

## 2014-07-28 MED ORDER — MEPERIDINE HCL 100 MG/ML IJ SOLN
INTRAMUSCULAR | Status: AC
  Filled 2014-07-28: qty 2

## 2014-07-28 MED ORDER — MIDAZOLAM HCL 5 MG/5ML IJ SOLN
INTRAMUSCULAR | Status: AC
  Filled 2014-07-28: qty 10

## 2014-07-28 MED ORDER — MEPERIDINE HCL 100 MG/ML IJ SOLN
INTRAMUSCULAR | Status: DC | PRN
  Administered 2014-07-28: 25 mg via INTRAVENOUS
  Administered 2014-07-28: 50 mg via INTRAVENOUS

## 2014-07-28 NOTE — Op Note (Signed)
[facility] [address] [place], [id number]   ENDOSCOPY PROCEDURE REPORT  PATIENT: Katherine Hoffman, Katherine Hoffman  MR#: [id number] BIRTHDATE: 1940-07-30 , 73  yrs. old GENDER: female ENDOSCOPIST: R.  Roetta SessionsMichael Norman Piacentini, MD FACP FACG REFERRED BY:  Simone CuriaStephen Luking, Hoffman.D. PROCEDURE DATE:  07/28/2014 PROCEDURE:  EGD w/ biopsy INDICATIONS:  hematochezia; elevated BUN; long-standing GERD; NSAID use.  H&H now normal. MEDICATIONS: Versed 4 mg IV and Demerol 75 mg IV in divided doses. Zofran 4 mg IV.  Xylocaine gel orally. ASA CLASS:      Class II  CONSENT: The risks, benefits, limitations, alternatives and imponderables have been discussed.  The potential for biopsy, esophogeal dilation, etc. have also been reviewed.  Questions have been answered.  All parties agreeable.  Please see the history and physical in the medical record for more information.  DESCRIPTION OF PROCEDURE: After the risks benefits and alternatives of the procedure were thoroughly explained, informed consent was obtained.  The [id number] ([id number]) endoscope was introduced through the mouth and advanced to the second portion of the duodenum , limited by Without limitations. The instrument was slowly withdrawn as the mucosa was fully examined. Estimated blood loss is zero unless otherwise noted in this procedure report.    Entirely normal-appearing tubular esophagus.  Stomach empty.  Very small hiatal hernia.  Significant mottling and patchy erythema of the gastric mucosa diffusely.  However, no ulcer or erosion or infiltrating process observed.  Patent pylorus.  Normal-appearing first and second portion of the duodenum.  Retroflexed views revealed as previously described.     The scope was then withdrawn from the patient and the procedure completed.  COMPLICATIONS: There were no immediate complications.  ENDOSCOPIC IMPRESSION: Tiny hiatal hernia. Abnormal gastric mucosa of uncertain significance?"status post  gastric biopsy.  RECOMMENDATIONS: Continue omeprazole 40 mg daily. Continue to hold aspirin and Naprosyn until July 15, then may resume. Follow-up on pathology. Office visit with us in 6 weeks. I continue to suspect a self-limiting diverticular bleed  recently.   I discussed my findings and recommendations at length with patient's daughter today.  REPEAT EXAM:  eSigned:  R. Roetta SessionsMichael Tulani Kidney, MD FACP Clementeen GrahamFACG 07/28/2014 8:22 AM    CC:  CPT CODES: ICD CODES:  The ICD and CPT codes recommended by this software are interpretations from the data that the clinical staff has captured with the software.  The verification of the translation of this report to the ICD and CPT codes and modifiers is the sole responsibility of the health care institution and practicing physician where this report was generated.  [employer] will not be held responsible for the validity of the ICD and CPT codes included on this report.  AMA assumes no liability for data contained or not contained herein. CPT is a Publishing rights managerregistered trademark of the [employer].  PATIENT NAME:  Katherine Hoffman, Katherine Hoffman MR#: [id number]

## 2014-07-28 NOTE — H&P (View-Only) (Signed)
Primary Care Physician:  Lubertha SouthSteve Luking, MD Primary Gastroenterologist:  Dr. Jena Gaussourk  Pre-Procedure History & Physical: HPI:  Katherine PimpleChristine M Hoffman is a 74 y.o. female here for to follow her for EGD. Had an overnight episode of painless rectal bleeding multiple episodes last week. Presented to the ED and was evaluated. Stayed most of the day; she remained hemodynamically stable and was given IV fluids; H&H initially 14.2 and 40.5; follow-up H&H later in the afternoon after hydration came back 11.9 and 34.2. Her BUN was minimally elevated at 22. She declined overnight observation.  She states she's had some old blood in her stool for couple days following her ED visit. Bowel movements today look normal as she reports. She's never had any abdominal pain; her typical reflux symptoms are well controlled on omeprazole 40 mg daily. Has not had any dysphagia, nausea or vomiting. She's not had anything that resembles melena. States she feels a little bit weak last last couple days but otherwise not having any GI symptoms.  No history of GI bleeding. No history of peptic ulcer disease.  She underwent a screening colonoscopy just last year she spent have internal hemorrhoids and pan-colonic diverticulosis only.   Past Medical History  Diagnosis Date  . GERD (gastroesophageal reflux disease)   . Diverticulosis   . Hemorrhoids     Past Surgical History  Procedure Laterality Date  . Colonoscopy   12/23/2005    Dr. Calbert Hulsebus:Few scattered pancolonic diverticula/Remainder of the colonic mucosa appeared normal/normal rectum  . Foot surgery    . Colonoscopy N/A 05/14/2013    Dr.Annaleah Arata- colonic diverticulosis. internal hemorrhoids    Prior to Admission medications   Medication Sig Start Date End Date Taking? Authorizing Provider  ALPRAZolam (XANAX) 1 MG tablet TAKE (1) TABLET BY MOUTH AT BEDTIME AS NEEDED. Patient taking differently: Take 1 mg by mouth at bedtime as needed for sleep.  02/07/14  Yes Merlyn AlbertWilliam S Luking,  MD  aspirin EC 81 MG tablet Take 81 mg by mouth daily.   Yes Historical Provider, MD  CINNAMON PO Take 1,000 mg by mouth daily.   Yes Historical Provider, MD  Cranberry (SM CRANBERRY) 300 MG tablet Take 300 mg by mouth daily.   Yes Historical Provider, MD  Multiple Vitamin (MULTIVITAMIN) capsule Take 1 capsule by mouth daily.   Yes Historical Provider, MD  naproxen (NAPROSYN) 500 MG tablet TAKE (1) TABLET BY MOUTH TWICE A DAY AS NEEDED. Patient taking differently: take 1 tablet by mouth once daily 03/10/14  Yes Merlyn AlbertWilliam S Luking, MD  Omega-3 Fatty Acids (FISH OIL) 1000 MG CAPS Take 1 capsule by mouth daily.   Yes Historical Provider, MD  omeprazole (PRILOSEC) 40 MG capsule TAKE (1) CAPSULE BY MOUTH ONCE DAILY. Patient taking differently: Take 40 mg by mouth daily.  02/07/14  Yes Merlyn AlbertWilliam S Luking, MD    Allergies as of 07/26/2014 - Review Complete 07/26/2014  Allergen Reaction Noted  . Keflex [cephalexin] Anaphylaxis and Rash 11/13/2012  . Theophyllines Anaphylaxis and Rash 11/13/2012  . Codeine Other (See Comments) 07/22/2014    Family History  Problem Relation Age of Onset  . Colon cancer      POSSIBLY FATHER    History   Social History  . Marital Status: Widowed    Spouse Name: N/A  . Number of Children: N/A  . Years of Education: N/A   Occupational History  . Retired     [employer]   Social History Main Topics  . Smoking status:  Never Smoker   . Smokeless tobacco: Not on file  . Alcohol Use: No  . Drug Use: No  . Sexual Activity: Not on file   Other Topics Concern  . Not on file   Social History Narrative    Review of Systems: See HPI, otherwise negative ROS  Physical Exam: BP 154/85 mmHg  Pulse 84  Temp(Src) 97.8 F (36.6 C) (Oral)  Ht  (1.575 m)  Wt 122 lb 12.8 oz (55.702 kg)  BMI 22.45 kg/m2 General:   Alert,  Well-developed, well-nourished, pleasant and cooperative in NAD Skin:  Intact without significant lesions or rashes. Eyes:  Sclera  clear, no icterus.   Conjunctiva pink. Ears:  Normal auditory acuity. Nose:  No deformity, discharge,  or lesions. Mouth:  No deformity or lesions. Neck:  Supple; no masses or thyromegaly. No significant cervical adenopathy. Lungs:  Clear throughout to auscultation.   No wheezes, crackles, or rhonchi. No acute distress. Heart:  Regular rate and rhythm; no murmurs, clicks, rubs,  or gallops. Abdomen: Non-distended, normal bowel sounds.  Soft and nontender without appreciable mass or hepatosplenomegaly.  Pulses:  Normal pulses noted. Extremities:  Without clubbing or edema.  Impression:  74 year old lady presented last week with painless hematochezia. It was self limiting. No melena. Absolutely no abdominal discomfort associated with the episode.  Hemoglobin declined from 14.2-11.9. BUN slightly elevated at 22 on presentation. She's had no further occurrences. She has known diverticulosis. She takes Naprosyn once daily along with a baby aspirin.  I suspect we're dealing with a self-limiting diverticular bleed. Would be unusual for this to be related to ischemic colitis without diarrhea or abdominal discomfort preceding the episodes ofbleeding.  Likewise,  a rapid transit upper GI source would be somewhat unusual in that she has initially displayed a relatively modest decline in her hemoglobin and episode was self limiting. BUN slightly elevated on presentation.. Aspirin and Naprosyn use wouldincrease the GI bleeding risk,  She has been on concomitant PPI therapy which would provide some gastric cytoprotection.  Recommendations:  Repeat an H&H today and see where we stand. Continue omeprazole daily. I've asked the patient to refrain from taking either aspirin or Naprosyn for the next 2 weeks as a precaution. I do not believe she needs a colonoscopy at this time.  Will make further recommendations in the very near future.      Notice: This dictation was prepared with Dragon dictation along with  smaller phrase technology. Any transcriptional errors that result from this process are unintentional and may not be corrected upon review.

## 2014-07-28 NOTE — Discharge Instructions (Addendum)
EGD Discharge instructions Please read the instructions outlined below and refer to this sheet in the next few weeks. These discharge instructions provide you with general information on caring for yourself after you leave the hospital. Your doctor may also give you specific instructions. While your treatment has been planned according to the most current medical practices available, unavoidable complications occasionally occur. If you have any problems or questions after discharge, please call your doctor. ACTIVITY  You may resume your regular activity but move at a slower pace for the next 24 hours.   Take frequent rest periods for the next 24 hours.   Walking will help expel (get rid of) the air and reduce the bloated feeling in your abdomen.   No driving for 24 hours (because of the anesthesia (medicine) used during the test).   You may shower.   Do not sign any important legal documents or operate any machinery for 24 hours (because of the anesthesia used during the test).  NUTRITION  Drink plenty of fluids.   You may resume your normal diet.   Begin with a light meal and progress to your normal diet.   Avoid alcoholic beverages for 24 hours or as instructed by your caregiver.  MEDICATIONS  You may resume your normal medications unless your caregiver tells you otherwise.  WHAT YOU CAN EXPECT TODAY  You may experience abdominal discomfort such as a feeling of fullness or gas pains.  FOLLOW-UP  Your doctor will discuss the results of your test with you.  SEEK IMMEDIATE MEDICAL ATTENTION IF ANY OF THE FOLLOWING OCCUR:  Excessive nausea (feeling sick to your stomach) and/or vomiting.   Severe abdominal pain and distention (swelling).   Trouble swallowing.   Temperature over 101 F (37.8 C).   Rectal bleeding or vomiting of blood.    GERD information provided  Continue omeprazole 40 mg daily  May resume aspirin and Naprosyn on July 15  Office visit with us in 6  weeks from now  Further recommendations to follow pending review of pathology report  Gastroesophageal Reflux Disease, Adult Gastroesophageal reflux disease (GERD) happens when acid from your stomach flows up into the esophagus. When acid comes in contact with the esophagus, the acid causes soreness (inflammation) in the esophagus. Over time, GERD may create small holes (ulcers) in the lining of the esophagus. CAUSES   Increased body weight. This puts pressure on the stomach, making acid rise from the stomach into the esophagus.  Smoking. This increases acid production in the stomach.  Drinking alcohol. This causes decreased pressure in the lower esophageal sphincter (valve or ring of muscle between the esophagus and stomach), allowing acid from the stomach into the esophagus.  Late evening meals and a full stomach. This increases pressure and acid production in the stomach.  A malformed lower esophageal sphincter. Sometimes, no cause is found. SYMPTOMS   Burning pain in the lower part of the mid-chest behind the breastbone and in the mid-stomach area. This may occur twice a week or more often.  Trouble swallowing.  Sore throat.  Dry cough.  Asthma-like symptoms including chest tightness, shortness of breath, or wheezing. DIAGNOSIS  Your caregiver may be able to diagnose GERD based on your symptoms. In some cases, X-rays and other tests may be done to check for complications or to check the condition of your stomach and esophagus. TREATMENT  Your caregiver may recommend over-the-counter or prescription medicines to help decrease acid production. Ask your caregiver before starting or adding any  new medicines.  HOME CARE INSTRUCTIONS   Change the factors that you can control. Ask your caregiver for guidance concerning weight loss, quitting smoking, and alcohol consumption.  Avoid foods and drinks that make your symptoms worse, such as:  Caffeine or alcoholic  drinks.  Chocolate.  Peppermint or mint flavorings.  Garlic and onions.  Spicy foods.  Citrus fruits, such as oranges, lemons, or limes.  Tomato-based foods such as sauce, chili, salsa, and pizza.  Fried and fatty foods.  Avoid lying down for the 3 hours prior to your bedtime or prior to taking a nap.  Eat small, frequent meals instead of large meals.  Wear loose-fitting clothing. Do not wear anything tight around your waist that causes pressure on your stomach.  Raise the head of your bed 6 to 8 inches with wood blocks to help you sleep. Extra pillows will not help.  Only take over-the-counter or prescription medicines for pain, discomfort, or fever as directed by your caregiver.  Do not take aspirin, ibuprofen, or other nonsteroidal anti-inflammatory drugs (NSAIDs). SEEK IMMEDIATE MEDICAL CARE IF:   You have pain in your arms, neck, jaw, teeth, or back.  Your pain increases or changes in intensity or duration.  You develop nausea, vomiting, or sweating (diaphoresis).  You develop shortness of breath, or you faint.  Your vomit is green, yellow, black, or looks like coffee grounds or blood.  Your stool is red, bloody, or black. These symptoms could be signs of other problems, such as heart disease, gastric bleeding, or esophageal bleeding. MAKE SURE YOU:   Understand these instructions.  Will watch your condition.  Will get help right away if you are not doing well or get worse. Document Released: 10/24/2004 Document Revised: 04/08/2011 Document Reviewed: 08/03/2010 [facility] Patient Information 2015 [place], [place]. This information is not intended to replace advice given to you by your health care provider. Make sure you discuss any questions you have with your health care provider.

## 2014-07-28 NOTE — Interval H&P Note (Signed)
History and Physical Interval Note:  07/28/2014 7:38 AM  Katherine Hoffman  has presented today for surgery, with the diagnosis of gi bleed  The various methods of treatment have been discussed with the patient and family. After consideration of risks, benefits and other options for treatment, the patient has consented to  Procedure(s) with comments: ESOPHAGOGASTRODUODENOSCOPY (EGD) (N/A) - 730 as a surgical intervention .  The patient's history has been reviewed, patient examined, no change in status, stable for surgery.  I have reviewed the patient's chart and labs.  Questions were answered to the patient's satisfaction.     [name]  Patient continues to do well since seen in the office. H&H came back normal but towards lower limits. No GI symptoms. No bleeding. Because of long-standing GERD, NSAID, no prior EGD along with a suspicious bump in her BUN, she is undergoing a diagnostic EGD to cover all the bases.  The risks, benefits, limitations, alternatives and imponderables have been reviewed with the patient. Potential for  biopsy, etc. have also been reviewed.  Questions have been answered. All parties agreeable.

## 2014-07-29 ENCOUNTER — Encounter (HOSPITAL_COMMUNITY): Payer: Self-pay | Admitting: Internal Medicine

## 2014-08-01 ENCOUNTER — Encounter: Payer: Self-pay | Admitting: Internal Medicine

## 2014-08-09 ENCOUNTER — Other Ambulatory Visit: Payer: Self-pay | Admitting: Family Medicine

## 2014-08-09 NOTE — Telephone Encounter (Signed)
Ok six mo worth 

## 2014-08-09 NOTE — Telephone Encounter (Signed)
Last seen 06/30/14

## 2014-08-10 ENCOUNTER — Other Ambulatory Visit: Payer: Self-pay | Admitting: Family Medicine

## 2014-08-10 NOTE — Telephone Encounter (Signed)
plz mark as done so this will go away

## 2014-08-12 NOTE — Telephone Encounter (Signed)
This was a double message, plz ck off as done

## 2014-09-08 ENCOUNTER — Other Ambulatory Visit: Payer: Self-pay | Admitting: Family Medicine

## 2014-09-08 NOTE — Telephone Encounter (Signed)
May refill this and 1 more

## 2014-09-12 ENCOUNTER — Encounter: Payer: Self-pay | Admitting: Gastroenterology

## 2014-09-12 ENCOUNTER — Telehealth: Payer: Self-pay | Admitting: Gastroenterology

## 2014-09-12 ENCOUNTER — Ambulatory Visit (INDEPENDENT_AMBULATORY_CARE_PROVIDER_SITE_OTHER): Payer: Medicare Other | Admitting: Gastroenterology

## 2014-09-12 VITALS — BP 163/80 | HR 71 | Temp 98.1°F | Ht 62.5 in | Wt 124.6 lb

## 2014-09-12 DIAGNOSIS — K625 Hemorrhage of anus and rectum: Secondary | ICD-10-CM

## 2014-09-12 NOTE — Patient Instructions (Signed)
Please seek medical attention if you see any further rectal bleeding.   We will see you back as needed.

## 2014-09-12 NOTE — Assessment & Plan Note (Signed)
Likely secondary to diverticular bleed in the setting of chronic NSAID use. EGD on file to rule out rapid upper GI transit, noting chronic gastritis. Continue Prilosec 40 mg daily. Next colonoscopy due in 2020 if health permits. Seek medical attention if any further overt, large-volume rectal bleeding. Return prn.

## 2014-09-12 NOTE — Telephone Encounter (Signed)
Please have patient put on recall list for colonoscopy in 2020.

## 2014-09-12 NOTE — Progress Notes (Signed)
Referring Provider: Merlyn Albert, MD Primary Care Physician:  Lubertha South, MD  Primary GI: Dr. Jena Gauss   Chief Complaint  Patient presents with  . Follow-up    HPI:   Katherine Hoffman is a 74 y.o. female presenting today with a history of hematochezia, slightly elevated BUN, long-standing GERD and NSAID use s/p EGD June 30th. Chronic gastritis noted. It was felt she may have had a self-limiting diverticular bleed. Here for routine follow-up.   Has lots of questions about how much blood she lost. Taking Protonix after eating. Swims daily. Multiple questions regarding source of potential bleeding. No further evidence of overt GI bleeding. Has a good appetite. No N/V. No constipation/diarrhea.   Past Medical History  Diagnosis Date  . GERD (gastroesophageal reflux disease)   . Diverticulosis   . Hemorrhoids     Past Surgical History  Procedure Laterality Date  . Colonoscopy   12/23/2005    Dr. Rourk:Few scattered pancolonic diverticula/Remainder of the colonic mucosa appeared normal/normal rectum  . Foot surgery    . Colonoscopy N/A 05/14/2013    Dr.Rourk- colonic diverticulosis. internal hemorrhoids  . Esophagogastroduodenoscopy N/A 07/28/2014    Dr. Jena Gauss: tiny hiatal hernia, abnormal gastric mucosa s/p gastric biopsy revealing chronic gastritis    Current Outpatient Prescriptions  Medication Sig Dispense Refill  . acetaminophen (TYLENOL) 650 MG CR tablet Take 650 mg by mouth every 8 (eight) hours as needed for pain.    Marland Kitchen ALPRAZolam (XANAX) 1 MG tablet TAKE (1) TABLET BY MOUTH AT BEDTIME AS NEEDED. 30 tablet 5  . aspirin EC 81 MG tablet Take 81 mg by mouth daily.    Marland Kitchen CINNAMON PO Take 1,000 mg by mouth daily.    . Cranberry (SM CRANBERRY) 300 MG tablet Take 300 mg by mouth daily.    . Multiple Vitamin (MULTIVITAMIN) capsule Take 1 capsule by mouth daily.    . naproxen (NAPROSYN) 500 MG tablet TAKE (1) TABLET BY MOUTH TWICE A DAY AS NEEDED. 60 tablet 1  . Omega-3 Fatty  Acids (FISH OIL) 1000 MG CAPS Take 1 capsule by mouth daily.    Marland Kitchen omeprazole (PRILOSEC) 40 MG capsule TAKE (1) CAPSULE BY MOUTH ONCE DAILY. (Patient taking differently: Take 40 mg by mouth daily. ) 30 capsule 11   No current facility-administered medications for this visit.    Allergies as of 09/12/2014 - Review Complete 09/12/2014  Allergen Reaction Noted  . Keflex [cephalexin] Anaphylaxis and Rash 11/13/2012  . Theophyllines Anaphylaxis and Rash 11/13/2012  . Codeine Other (See Comments) 07/22/2014    Family History  Problem Relation Age of Onset  . Colon cancer      POSSIBLY FATHER    Social History   Social History  . Marital Status: Widowed    Spouse Name: N/A  . Number of Children: N/A  . Years of Education: N/A   Occupational History  . Retired     [employer] architect [employer]   Social History Main Topics  . Smoking status: Never Smoker   . Smokeless tobacco: None  . Alcohol Use: No  . Drug Use: No  . Sexual Activity: Not Asked   Other Topics Concern  . None   Social History Narrative    Review of Systems: As mentioned in HPI.   Physical Exam: BP 163/80 mmHg  Pulse 71  Temp(Src) 98.1 F (36.7 C)  Ht 5' 2.5" (1.588 m)  Wt 124 lb 9.6 oz (56.518 kg)  BMI 22.41 kg/m2 General:  Alert and oriented. No distress noted. Pleasant and cooperative.  Head:  Normocephalic and atraumatic. Eyes:  Conjuctiva clear without scleral icterus. Mouth:  Oral mucosa pink and moist. Good dentition. No lesions. Abdomen:  +BS, soft, non-tender and non-distended. No rebound or guarding. No HSM or masses noted. Msk:  Symmetrical without gross deformities. Normal posture. Extremities:  Without edema. Neurologic:  Alert and  oriented x4;  grossly normal neurologically. Psych:  Alert and cooperative. Normal mood and affect.

## 2014-09-13 NOTE — Telephone Encounter (Signed)
ON RECALL  °

## 2014-09-13 NOTE — Progress Notes (Signed)
cc'ed to pcp °

## 2014-10-31 ENCOUNTER — Ambulatory Visit (INDEPENDENT_AMBULATORY_CARE_PROVIDER_SITE_OTHER): Payer: Medicare Other | Admitting: Family Medicine

## 2014-10-31 ENCOUNTER — Encounter: Payer: Self-pay | Admitting: Family Medicine

## 2014-10-31 VITALS — BP 130/88 | Ht 61.5 in | Wt 125.0 lb

## 2014-10-31 DIAGNOSIS — G4700 Insomnia, unspecified: Secondary | ICD-10-CM

## 2014-10-31 DIAGNOSIS — K219 Gastro-esophageal reflux disease without esophagitis: Secondary | ICD-10-CM

## 2014-10-31 DIAGNOSIS — M159 Polyosteoarthritis, unspecified: Secondary | ICD-10-CM

## 2014-10-31 DIAGNOSIS — M150 Primary generalized (osteo)arthritis: Secondary | ICD-10-CM | POA: Diagnosis not present

## 2014-10-31 MED ORDER — NAPROXEN 500 MG PO TABS: ORAL_TABLET | ORAL | Status: DC

## 2014-10-31 MED ORDER — TRAZODONE HCL 50 MG PO TABS: 25.0000 mg | ORAL_TABLET | Freq: Every evening | ORAL | Status: DC | PRN

## 2014-10-31 NOTE — Progress Notes (Signed)
   Subjective:    Patient ID: Katherine Hoffman, female    DOB: 06-Jun-1940, 74 y.o.   MRN: [id number]  HPI Patient is here today for follow up on insomnia. Patient states that the Xanax is not helping with her insomnia anymore. 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 Pt came off naproxen at the request of dr Kendell Bane.  Dr. Aura Fey complete note reviewed. Upper endoscopy did reveal element of gastritis. In the same time symptoms are now controlled on Prilosec daily.  She would like to discuss these issues with the doctor today.   Patient M tremendous difficulties with insomnia. States she needs to take something other than Xanax. Next  Patient reports reflux overall is stable. Less discomfort. Compliant with omeprazole. Pt declines flu shot    Review of Systems No headache no chest pain no pain no abdominal pain no change in bowel habits complete ROS otherwise negative    Objective:   Physical Exam Alert vitals stable. HEENT normal neck supple. Lungs clear. Heart rare rhythm. Hands impressive Heberden's nodes diffusely grip intact good range of motion no trigger finger knees positive crepitations abdominal exam benign       Assessment & Plan:  Impression 1 reflux gastritis much improved #2 severe hand arthritis patient in tears stating Tylenol does not help and that Naprosyn helped a lot #3 insomnia significant problems discussed plan stopm start trazodone rationale discussed. Maintain omeprazole. Stop Tylenol alone and add Naprosyn once again realizing some risk as far as GI. Maintain diet and exercise WSL

## 2014-11-07 ENCOUNTER — Ambulatory Visit (INDEPENDENT_AMBULATORY_CARE_PROVIDER_SITE_OTHER): Payer: Medicare Other | Admitting: Family Medicine

## 2014-11-07 ENCOUNTER — Ambulatory Visit (HOSPITAL_COMMUNITY)
Admission: RE | Admit: 2014-11-07 | Discharge: 2014-11-07 | Disposition: A | Discharge status: Home / Self Care | Payer: Medicare Other | Source: Ambulatory Visit | Attending: Family Medicine | Admitting: Family Medicine

## 2014-11-07 VITALS — BP 134/82 | Ht 61.5 in | Wt 123.5 lb

## 2014-11-07 DIAGNOSIS — M25551 Pain in right hip: Secondary | ICD-10-CM | POA: Insufficient documentation

## 2014-11-07 MED ORDER — PREDNISONE 10 MG PO TABS: ORAL_TABLET | ORAL | Status: DC

## 2014-11-07 NOTE — Progress Notes (Signed)
   Subjective:    Patient ID: Katherine Hoffman, female    DOB: September 08, 1940, 74 y.o.   MRN: [id number]  Hip Pain  The incident occurred 3 to 5 days ago. There was no injury mechanism. The pain is present in the right hip. Quality: dull. The pain has been constant since onset. She has tried NSAIDs (naprosyn) for the symptoms.   Right hip pain sharp in nature worse with certain motions. Heme on the last few days. Recalls no injury. Next  Over-the-counter anti-inflammatories not helping. Local measures helping minimally.   Some limp when walking Review of Systems No knee pain and back pain no change in bowel habits    Objective:   Physical Exam   Alert vital stable lungs clear heart rhythm right lateral trochanteric region tender to palpation range of motion of hip good flexion good external rotation some discomfort     Assessment & Plan:  Impression probable trochanteric bursitis plan prednisone taper. X-ray involved hip symptom care discussed WSL

## 2014-11-14 ENCOUNTER — Telehealth: Payer: Self-pay | Admitting: Family Medicine

## 2014-11-14 MED ORDER — DICLOFENAC SODIUM 1 % TD GEL: TRANSDERMAL | Status: DC

## 2014-11-14 NOTE — Telephone Encounter (Signed)
Called patient and informed her per Dr.Steve Luking that voltaren gel was sent into pharmacy. Patient verbalized understanding.

## 2014-11-14 NOTE — Telephone Encounter (Signed)
Pt is requesting a pain relief [facility] to be [facility].

## 2014-11-14 NOTE — Telephone Encounter (Signed)
voltaren gel bid tpo affected area

## 2014-11-16 DIAGNOSIS — M5441 Lumbago with sciatica, right side: Secondary | ICD-10-CM | POA: Diagnosis not present

## 2014-11-16 DIAGNOSIS — M25551 Pain in right hip: Secondary | ICD-10-CM | POA: Diagnosis not present

## 2014-11-22 ENCOUNTER — Ambulatory Visit (HOSPITAL_COMMUNITY): Payer: Medicare Other | Admitting: Physical Therapy

## 2014-11-28 ENCOUNTER — Ambulatory Visit (HOSPITAL_COMMUNITY): Payer: Medicare Other | Attending: Sports Medicine | Admitting: Physical Therapy

## 2014-11-28 DIAGNOSIS — M791 Myalgia: Secondary | ICD-10-CM | POA: Diagnosis not present

## 2014-11-28 DIAGNOSIS — M25551 Pain in right hip: Secondary | ICD-10-CM | POA: Diagnosis not present

## 2014-11-28 DIAGNOSIS — R262 Difficulty in walking, not elsewhere classified: Secondary | ICD-10-CM | POA: Diagnosis not present

## 2014-11-28 DIAGNOSIS — M25651 Stiffness of right hip, not elsewhere classified: Secondary | ICD-10-CM | POA: Diagnosis not present

## 2014-11-28 DIAGNOSIS — R29898 Other symptoms and signs involving the musculoskeletal system: Secondary | ICD-10-CM | POA: Diagnosis not present

## 2014-11-28 DIAGNOSIS — M7061 Trochanteric bursitis, right hip: Secondary | ICD-10-CM | POA: Diagnosis not present

## 2014-11-28 DIAGNOSIS — M7918 Myalgia, other site: Secondary | ICD-10-CM

## 2014-11-28 NOTE — Patient Instructions (Signed)
Piriformis Stretch, Sitting    Sit, one ankle on opposite knee, same-side hand on crossed knee. Push down on knee, keeping spine straight. Lean torso forward, with flat back, until tension is felt in hamstrings and gluteals of crossed-leg side. Hold _30__ seconds.  Repeat __3_ times per session. Do _1__ sessions per day.     HIP: Hamstrings - Short Sitting    Rest leg on raised surface. Keep knee straight. Lift chest. Hold _30__ seconds. __3_ reps per set, _1__ sets per day, __7_ days per week   Bridging    Slowly raise buttocks from floor, keeping stomach tight. Repeat __10__ times per set. Do __1__ sets per session. Do __1__ sessions per day.  http://orth.exer.us/1097   Copyright  VHI. All rights reserved.

## 2014-11-29 ENCOUNTER — Ambulatory Visit (HOSPITAL_COMMUNITY): Payer: Medicare Other | Attending: Sports Medicine | Admitting: Physical Therapy

## 2014-11-29 ENCOUNTER — Telehealth: Payer: Self-pay | Admitting: Family Medicine

## 2014-11-29 DIAGNOSIS — R29898 Other symptoms and signs involving the musculoskeletal system: Secondary | ICD-10-CM | POA: Insufficient documentation

## 2014-11-29 DIAGNOSIS — R262 Difficulty in walking, not elsewhere classified: Secondary | ICD-10-CM

## 2014-11-29 DIAGNOSIS — M791 Myalgia: Secondary | ICD-10-CM | POA: Insufficient documentation

## 2014-11-29 DIAGNOSIS — M25651 Stiffness of right hip, not elsewhere classified: Secondary | ICD-10-CM | POA: Diagnosis not present

## 2014-11-29 DIAGNOSIS — M7918 Myalgia, other site: Secondary | ICD-10-CM

## 2014-11-29 DIAGNOSIS — M25551 Pain in right hip: Secondary | ICD-10-CM

## 2014-11-29 DIAGNOSIS — M7061 Trochanteric bursitis, right hip: Secondary | ICD-10-CM | POA: Diagnosis not present

## 2014-11-29 NOTE — Therapy (Signed)
[facility] [address] [place], [place], [address] Phone: [phone]   Fax:  [phone]  Physical Therapy Treatment  Patient Details  Name: Katherine Hoffman MRN: [id number] Date of Birth: 09-12-1940 Referring Provider: Janit Pagan  Encounter Date: 11/29/2014      PT End of Session - 11/29/14 1002    Visit Number 2   Number of Visits 10   Date for PT Re-Evaluation 12/28/14   Authorization Type UHC Medicare   Authorization Time Period 11/28/14-01/28/15   Authorization - Visit Number 2   Authorization - Number of Visits 10   PT Start Time 0935   PT Stop Time 1016   PT Time Calculation (min) 41 min   Activity Tolerance Patient tolerated treatment well      Past Medical History  Diagnosis Date  . GERD (gastroesophageal reflux disease)   . Diverticulosis   . Hemorrhoids     Past Surgical History  Procedure Laterality Date  . Colonoscopy   12/23/2005    Dr. Rourk:Few scattered pancolonic diverticula/Remainder of the colonic mucosa appeared normal/normal rectum  . Foot surgery    . Colonoscopy N/A 05/14/2013    Dr.Rourk- colonic diverticulosis. internal hemorrhoids  . Esophagogastroduodenoscopy N/A 07/28/2014    Dr. Jena Gauss: tiny hiatal hernia, abnormal gastric mucosa s/p gastric biopsy revealing chronic gastritis    There were no vitals filed for this visit.  Visit Diagnosis:  Right hip pain  Stiffness of right hip joint  Difficulty walking  Weakness of right leg  Piriformis muscle pain  Trochanteric bursitis of right hip      Subjective Assessment - 11/29/14 0934    Subjective Pt states that she has done her exercises and did not have pain last night.   Currently in Pain? Yes   Pain Score 5    Pain Location Knee   Pain Orientation Right;Anterior   Pain Type Chronic pain            [facility] PT Assessment - 11/29/14 0001    Assessment   Medical Diagnosis R hip/leg pain   Onset Date/Surgical Date 11/07/14    Next MD Visit none scheduled   Prior Therapy no   Home Environment   Living Environment Private residence   Living Arrangements Alone   Type of Home House   Home Access Stairs to enter   Entrance Stairs-Number of Steps 3   Entrance Stairs-Rails Right;Left   Prior Function   Level of Independence Independent   Vocation Retired   Leisure Prior to hip pain, pt was walking 3 miles daily, was mowing the yard.    Observation/Other Assessments   Focus on Therapeutic Outcomes (FOTO)  58% limited   ROM / Strength   AROM / PROM / Strength AROM;Strength   AROM   Overall AROM Comments Lumbar AROM WNL   AROM Assessment Site Hip   Right/Left Hip Right;Left   Right Hip External Rotation  35   Right Hip Internal Rotation  39   Left Hip External Rotation  34   Left Hip Internal Rotation  38   Strength   Strength Assessment Site Hip;Knee   Right Hip Flexion 3/5   Right Hip Extension 3/5   Right Hip ABduction 3/5   Left Hip Flexion 4/5   Left Hip Extension 4-/5   Left Hip ABduction 4/5   Right/Left Knee Right;Left   Right Knee Flexion 4/5   Right Knee Extension 3/5   Left Knee Flexion 4/5  Left Knee Extension 5/5   Palpation   SI assessment  SI alignment checked, all landmarks even in standing and in supine   Palpation comment Increased muscle tone and tenderness with superficial palpation over R piriformis, R glut max, R trochanteric bursa, R ITB, and R gastroc   Special Tests    Special Tests Sacrolliac Tests;Lumbar;Hip Special Tests   Lumbar Tests Straight Leg Raise   Sacroiliac Tests  Pelvic Compression   Hip Special Tests  Ober's Test   Straight Leg Raise   Findings Negative   Pelvic Dictraction   Findings Negative   Pelvic Compression   Findings Negative   Ober's Test   Findings Positive   Side Right   Transfers   Five time sit to stand comments  20.36"   6 minute walk test results    Aerobic Endurance Distance Walked 1175                     [facility]  [facility]ult [facility] Treatment/Exercise - 11/29/14 0939    Exercises   Exercises Knee/Hip   Knee/Hip Exercises: Stretches   Active Hamstring Stretch Both;3 reps;30 seconds   Active Hamstring Stretch Limitations supine    Quad Stretch Both;3 reps;30 seconds   Piriformis Stretch Right;2 reps;30 seconds   Gastroc Stretch Both;3 reps;30 seconds   Gastroc Stretch Limitations slant board    Knee/Hip Exercises: Standing   Heel Raises Both;10 reps   Functional Squat 10 reps   Other Standing Knee Exercises 3 D hip excursions x 3    Knee/Hip Exercises: Supine   Bridges Strengthening;Both;10 reps   Straight Leg Raises Strengthening;Both;10 reps   Straight Leg Raises Limitations keeping abdominal mm engaged                 PT Education - 11/29/14 [phone]    Education provided Yes   Education Details new stretches and exercises    Person(s) Educated Patient   Methods Explanation;Verbal cues;Handout   Comprehension Verbalized understanding;Returned demonstration          PT Short Term Goals - 11/29/14 0830    PT SHORT TERM GOAL #1   Title Pt will be independent in HEP.   Time 2   Period Weeks   Status New   PT SHORT TERM GOAL #2   Title Improve RLE strength to 4-/5 or greater to improve functional mobillity and gait mechanics.    Time 2   Period Weeks   Status New   PT SHORT TERM GOAL #3   Title Pt will complete five time sit to stand in 15 seconds to demonstrate increased power in BLE.    Time 2   Period Weeks   Status New   PT SHORT TERM GOAL #4   Title Pt will ambulate 1200 feet in 6MWT to demonstrate improved gait speed and functional mobility.    Time 2   Period Weeks   Status New           PT Long Term Goals - 11/29/14 [id number]    PT LONG TERM GOAL #1   Title Pt will be independent in advanced HEP.   Time 4   Period Weeks   Status New   PT LONG TERM GOAL #2   Title Imiprove RLE strength to 4+/5 to improve gait mechanics and ability to climb stairs.   Time 4   Period  Weeks   Status New   PT LONG TERM GOAL #3   Title Pt will  complete five time sit to stand in 12 seconds or less to demonstrate improved power of BLE.   Time 4   Period Weeks   Status New   PT LONG TERM GOAL #4   Title Pt will report that she has walked 3 miles to demonstrate return to PLOF.    Time 4   Period Weeks   Status New               Plan - 05-Dec-2014 1016    Clinical Impression Statement Pt instructed in new strengthening and stretching exercises with minimal cuing needed for proper technique.  Pt states chronic back pain for years which might be contributing to her hip pain.    PT Next Visit Plan continue to work on strengtheing of LE with step ups and lunging.           G-Codes - 2014-12-05 0845    Functional Assessment Tool Used FOTO   Functional Limitation Mobility: Walking and moving around   Mobility: Walking and Moving Around Current Status [phone]) At least 40 percent but less than 60 percent impaired, limited or restricted   Mobility: Walking and Moving Around Goal Status [phone]) At least 40 percent but less than 60 percent impaired, limited or restricted      Problem List Patient Active Problem List   Diagnosis Date Noted  . Mucosal abnormality of stomach   . Rectal bleeding 04/26/2013  . Impaired fasting glucose 11/15/2012  . SCIATICA, ACUTE 03/26/2007  . INCREASED BLOOD PRESSURE 11/19/2006  . CONTUSION, FOREARM 11/19/2006  . CARPAL TUNNEL SYNDROME 02/26/2006  . GERD 02/26/2006  . Osteoarthritis 02/26/2006  . HIP PAIN, RIGHT 02/26/2006  . LOW BACK PAIN 02/26/2006  . Insomnia 02/26/2006    Virgina Organ, PT CLT [phone] 2014/12/05, 10:20 AM  [facility] [address] [place], [place], [address] Phone: [phone]   Fax:  [phone]  Name: Katherine Hoffman MRN: [id number] Date of Birth: June 29, 1940

## 2014-11-29 NOTE — Therapy (Addendum)
[facility] [address] [place], [place], [address] Phone: [phone]   Fax:  [phone]  Physical Therapy Evaluation  Patient Details  Name: Katherine Hoffman MRN: [id number] Date of Birth: [birth year] Referring Provider: Janit Pagan  Encounter Date: 11/28/2014      PT End of Session - 11/29/14 0759    Visit Number 1   Number of Visits 10   Date for PT Re-Evaluation 12/28/14   Authorization Type UHC Medicare   Authorization Time Period 11/28/14-01/28/15   Authorization - Visit Number 1   Authorization - Number of Visits 10   PT Start Time 1645   PT Stop Time 1725   PT Time Calculation (min) 40 min   Activity Tolerance Patient tolerated treatment well   Behavior During Therapy [facility] for tasks assessed/performed      Past Medical History  Diagnosis Date  . GERD (gastroesophageal reflux disease)   . Diverticulosis   . Hemorrhoids     Past Surgical History  Procedure Laterality Date  . Colonoscopy   12/23/2005    Dr. Rourk:Few scattered pancolonic diverticula/Remainder of the colonic mucosa appeared normal/normal rectum  . Foot surgery    . Colonoscopy N/A 05/14/2013    Dr.Rourk- colonic diverticulosis. internal hemorrhoids  . Esophagogastroduodenoscopy N/A 07/28/2014    Dr. Jena Gauss: tiny hiatal hernia, abnormal gastric mucosa s/p gastric biopsy revealing chronic gastritis    There were no vitals filed for this visit.  Visit Diagnosis:  Right hip pain  Stiffness of right hip joint  Difficulty walking  Weakness of right leg  Piriformis muscle pain  Trochanteric bursitis of right hip      Subjective Assessment - 11/28/14 1648    Subjective Pt reports that one day she was fine, and one day she woke up with pain in her hands, arms, and R hip which travels all the way down her leg. She reports that it happened suddenly on 10/10, and has gotten worse since then. Pt denies having any injury that caused her pain. She  reports that she has been having difficulty walking, and she feels like her knee is being pulled sideways.    How long can you sit comfortably? Immediately gets pain   How long can you stand comfortably? <15 minutes   How long can you walk comfortably? <10 minutes   Patient Stated Goals Decrease pain, return to walking 3 miles per day   Currently in Pain? Yes   Pain Score 7    Pain Location Hip   Pain Orientation Right   Pain Descriptors / Indicators Aching;Constant            [facility] PT Assessment - 11/29/14 0001    Assessment   Medical Diagnosis R hip/leg pain   Onset Date/Surgical Date 11/07/14   Next MD Visit none scheduled   Prior Therapy no   Home Environment   Living Environment Private residence   Living Arrangements Alone   Type of Home House   Home Access Stairs to enter   Entrance Stairs-Number of Steps 3   Entrance Stairs-Rails Right;Left   Prior Function   Level of Independence Independent   Vocation Retired   Leisure Prior to hip pain, pt was walking 3 miles daily, was mowing the yard.    Observation/Other Assessments   Focus on Therapeutic Outcomes (FOTO)  58% limited   ROM / Strength   AROM / PROM / Strength AROM;Strength   AROM   Overall AROM Comments Lumbar AROM  WNL   AROM Assessment Site Hip   Right/Left Hip Right;Left   Right Hip External Rotation  35   Right Hip Internal Rotation  39   Left Hip External Rotation  34   Left Hip Internal Rotation  38   Strength   Strength Assessment Site Hip;Knee   Right Hip Flexion 3/5   Right Hip Extension 3/5   Right Hip ABduction 3/5   Left Hip Flexion 4/5   Left Hip Extension 4-/5   Left Hip ABduction 4/5   Right/Left Knee Right;Left   Right Knee Flexion 4/5   Right Knee Extension 3/5   Left Knee Flexion 4/5   Left Knee Extension 5/5   Palpation   SI assessment  SI alignment checked, all landmarks even in standing and in supine   Palpation comment Increased muscle tone and tenderness with superficial  palpation over R piriformis, R glut max, R trochanteric bursa, R ITB, and R gastroc   Special Tests    Special Tests Sacrolliac Tests;Lumbar;Hip Special Tests   Lumbar Tests Straight Leg Raise   Sacroiliac Tests  Pelvic Compression   Hip Special Tests  Ober's Test   Straight Leg Raise   Findings Negative   Pelvic Dictraction   Findings Negative   Pelvic Compression   Findings Negative   Ober's Test   Findings Positive   Side Right   Transfers   Five time sit to stand comments  20.36"   6 minute walk test results    Aerobic Endurance Distance Walked 1175                           PT Education - 11/29/14 0758    Education provided Yes   Education Details HEP   Person(s) Educated Patient   Methods Explanation;Handout   Comprehension Verbalized understanding;Returned demonstration          PT Short Term Goals - 11/29/14 0830    PT SHORT TERM GOAL #1   Title Pt will be independent in HEP.   Time 2   Period Weeks   Status New   PT SHORT TERM GOAL #2   Title Improve RLE strength to 4-/5 or greater to improve functional mobillity and gait mechanics.    Time 2   Period Weeks   Status New   PT SHORT TERM GOAL #3   Title Pt will complete five time sit to stand in 15 seconds to demonstrate increased power in BLE.    Time 2   Period Weeks   Status New   PT SHORT TERM GOAL #4   Title Pt will ambulate 1200 feet in 6MWT to demonstrate improved gait speed and functional mobility.    Time 2   Period Weeks   Status New           PT Long Term Goals - 11/29/14 [id number]    PT LONG TERM GOAL #1   Title Pt will be independent in advanced HEP.   Time 4   Period Weeks   Status New   PT LONG TERM GOAL #2   Title Imiprove RLE strength to 4+/5 to improve gait mechanics and ability to climb stairs.   Time 4   Period Weeks   Status New   PT LONG TERM GOAL #3   Title Pt will complete five time sit to stand in 12 seconds or less to demonstrate improved power of  BLE.   Time 4  Period Weeks   Status New   PT LONG TERM GOAL #4   Title Pt will report that she has walked 3 miles to demonstrate return to PLOF.    Time 4   Period Weeks   Status New               Plan - 11/29/14 0800    Clinical Impression Statement Pt presents to PT with reports of increased pain in her RLE for the past 3 weeks. Upon examination, pt demonstrates weakness of RLE, difficulty walking, impaired functional mobility, tightness of R piriformis and ITB, and pain with palpation over RLE musculature. Lumbar ROM was WNL, and lumbar and SI joint special tests were all negative, with SI bony landmarks being even in today's treatment. Pt demonstrates s/s of trochanteric bursitis and piriformis syndrome, and will benefit from manual therapy, strengthening, and functional stretching to decrease pain levels and return pt to PLOF of walking 3 miles per day and tending to her yard.    Pt will benefit from skilled therapeutic intervention in order to improve on the following deficits Abnormal gait;Decreased activity tolerance;Decreased range of motion;Decreased strength;Difficulty walking;Impaired flexibility;Pain   Rehab Potential Good   PT Frequency 3x / week  2-3x per week   PT Duration 4 weeks   PT Treatment/Interventions ADLs/Self Care Home Management;Cryotherapy;Gait training;Stair training;Functional mobility training;Therapeutic activities;Therapeutic exercise;Balance training;Neuromuscular re-education;Patient/family education;Manual techniques;Passive range of motion   PT Next Visit Plan Review HEP, begin hip stretching and strengthening, manual to decrease tone/tightness in RLE musculature.           G-Codes - December 10, 2014 0845    Functional Assessment Tool Used FOTO   Functional Limitation Mobility: Walking and moving around   Mobility: Walking and Moving Around Current Status [phone]) At least 40 percent but less than 60 percent impaired, limited or restricted    Mobility: Walking and Moving Around Goal Status [phone]) At least 40 percent but less than 60 percent impaired, limited or restricted       Problem List Patient Active Problem List   Diagnosis Date Noted  . Mucosal abnormality of stomach   . Rectal bleeding 04/26/2013  . Impaired fasting glucose 11/15/2012  . SCIATICA, ACUTE 03/26/2007  . INCREASED BLOOD PRESSURE 11/19/2006  . CONTUSION, FOREARM 11/19/2006  . CARPAL TUNNEL SYNDROME 02/26/2006  . GERD 02/26/2006  . Osteoarthritis 02/26/2006  . HIP PAIN, RIGHT 02/26/2006  . LOW BACK PAIN 02/26/2006  . Insomnia 02/26/2006    Leona Singleton, PT, DPT [id number] 11/29/2014, 8:46 AM  [facility] [address] [place], [place], [address] Phone: [phone]   Fax:  [phone]  Name: Katherine Hoffman MRN: [id number] Date of Birth: [birth year]

## 2014-11-29 NOTE — Telephone Encounter (Signed)
Rx prior auth APPROVED for pt's diclofenac sodium (VOLTAREN) 1 % GEL, expires 11/25/2015 through [employer],

## 2014-11-29 NOTE — Patient Instructions (Signed)
Hamstring Stretch: Active    Support behind right knee. Starting with knee bent, attempt to straighten knee until a comfortable stretch is felt in back of thigh. Hold 30__ seconds. Repeat _3___ times per set. Do _1___ sets per session. Do ____ 2sessions per day.  http://orth.exer.us/158   Copyright  VHI. All rights reserved.  Knee-to-Chest Stretch: Unilateral    With hand behind right knee, pull knee in to chest until a comfortable stretch is felt in lower back and buttocks. Keep back relaxed. Hold _30___ seconds. Repeat ___3_ times per set. Do __1__ sets per session. Do ____ sessions per day. 2 http://orth.exer.us/126   Copyright  VHI. All rights reserved.  Backward Bend (Standing)    Arch backward to make hollow of back deeper. Hold _2___ seconds. Repeat 5____ times per set. Do _1___ sets per session. Do 3____ sessions per day.  http://orth.exer.us/178   Copyright  VHI. All rights reserved.  Strengthening: Hip Abduction (Side-Lying)    Tighten muscles on front of left thigh, then lift leg _15___ inches from surface, keeping knee locked.  Repeat _10___ times per set. Do ___1_ sets per session. Do _2___ sessions per day.  http://orth.exer.us/622   Copyright  VHI. All rights reserved.  Strengthening: Straight Leg Raise (Phase 1)    Tighten muscles on front of right thigh, then lift leg _15___ inches from surface, keeping knee locked.  Repeat ___10_ times per set. Do _1___ sets per session. Do __2__ sessions per day.  http://orth.exer.us/614   Copyright  VHI. All rights reserved.

## 2014-12-01 ENCOUNTER — Ambulatory Visit (HOSPITAL_COMMUNITY): Payer: Medicare Other

## 2014-12-01 DIAGNOSIS — R29898 Other symptoms and signs involving the musculoskeletal system: Secondary | ICD-10-CM

## 2014-12-01 DIAGNOSIS — M25651 Stiffness of right hip, not elsewhere classified: Secondary | ICD-10-CM | POA: Diagnosis not present

## 2014-12-01 DIAGNOSIS — M7061 Trochanteric bursitis, right hip: Secondary | ICD-10-CM | POA: Diagnosis not present

## 2014-12-01 DIAGNOSIS — M791 Myalgia: Secondary | ICD-10-CM | POA: Diagnosis not present

## 2014-12-01 DIAGNOSIS — M25551 Pain in right hip: Secondary | ICD-10-CM

## 2014-12-01 DIAGNOSIS — R262 Difficulty in walking, not elsewhere classified: Secondary | ICD-10-CM

## 2014-12-01 DIAGNOSIS — M7918 Myalgia, other site: Secondary | ICD-10-CM

## 2014-12-01 NOTE — Therapy (Signed)
[facility] [address] [place], [place], [address] Phone: [phone]   Fax:  [phone]  Physical Therapy Treatment  Patient Details  Name: Katherine Hoffman MRN: [id number] Date of Birth: 12-25-1940 Referring Provider: Janit Pagan  Encounter Date: 12/01/2014      PT End of Session - 12/01/14 1750    Visit Number 3   Number of Visits 10   Date for PT Re-Evaluation 12/28/14   Authorization Type UHC Medicare   Authorization Time Period 11/28/14-01/28/15   Authorization - Visit Number 3   Authorization - Number of Visits 10   PT Start Time 1740   PT Stop Time 1823   PT Time Calculation (min) 43 min   Activity Tolerance Patient tolerated treatment well   Behavior During Therapy [facility] for tasks assessed/performed      Past Medical History  Diagnosis Date  . GERD (gastroesophageal reflux disease)   . Diverticulosis   . Hemorrhoids     Past Surgical History  Procedure Laterality Date  . Colonoscopy   12/23/2005    Dr. Rourk:Few scattered pancolonic diverticula/Remainder of the colonic mucosa appeared normal/normal rectum  . Foot surgery    . Colonoscopy N/A 05/14/2013    Dr.Rourk- colonic diverticulosis. internal hemorrhoids  . Esophagogastroduodenoscopy N/A 07/28/2014    Dr. Jena Gauss: tiny hiatal hernia, abnormal gastric mucosa s/p gastric biopsy revealing chronic gastritis    There were no vitals filed for this visit.  Visit Diagnosis:  Right hip pain  Stiffness of right hip joint  Difficulty walking  Weakness of right leg  Piriformis muscle pain  Trochanteric bursitis of right hip      Subjective Assessment - 12/01/14 1743    Subjective Pt stated she has done a lot of stairs today at home, pain scale 4/10 today.     Currently in Pain? Yes   Pain Score 4    Pain Location Hip   Pain Orientation Right   Pain Descriptors / Indicators Aching            [facility] Adult PT Treatment/Exercise - 12/01/14 0001    Knee/Hip Exercises: Stretches   Active Hamstring Stretch Both;3 reps;30 seconds   Active Hamstring Stretch Limitations 12in step   Quad Stretch Both;3 reps;30 seconds   Hip Flexor Stretch Right;2 reps;30 seconds   Hip Flexor Stretch Limitations on 7in step   Gastroc Stretch Both;3 reps;30 seconds   Gastroc Stretch Limitations slant board    Knee/Hip Exercises: Standing   Heel Raises Both;10 reps   Heel Raises Limitations Toe raises 10x   Lateral Step Up Both;10 reps;Hand Hold: 2;Step Height: 4"   Forward Step Up Both;10 reps;Hand Hold: 1;Step Height: 6"   Functional Squat 10 reps   Stairs ascend 7 and descend 4in with 1 HR   Other Standing Knee Exercises 3 D hip excursions 10x            PT Short Term Goals - 11/29/14 0830    PT SHORT TERM GOAL #1   Title Pt will be independent in HEP.   Time 2   Period Weeks   Status New   PT SHORT TERM GOAL #2   Title Improve RLE strength to 4-/5 or greater to improve functional mobillity and gait mechanics.    Time 2   Period Weeks   Status New   PT SHORT TERM GOAL #3   Title Pt will complete five time sit to stand in 15 seconds to demonstrate increased power  in BLE.    Time 2   Period Weeks   Status New   PT SHORT TERM GOAL #4   Title Pt will ambulate 1200 feet in 6MWT to demonstrate improved gait speed and functional mobility.    Time 2   Period Weeks   Status New           PT Long Term Goals - 11/29/14 [id number]    PT LONG TERM GOAL #1   Title Pt will be independent in advanced HEP.   Time 4   Period Weeks   Status New   PT LONG TERM GOAL #2   Title Imiprove RLE strength to 4+/5 to improve gait mechanics and ability to climb stairs.   Time 4   Period Weeks   Status New   PT LONG TERM GOAL #3   Title Pt will complete five time sit to stand in 12 seconds or less to demonstrate improved power of BLE.   Time 4   Period Weeks   Status New   PT LONG TERM GOAL #4   Title Pt will report that she has walked 3 miles to  demonstrate return to PLOF.    Time 4   Period Weeks   Status New               Plan - 12/01/14 1751    Clinical Impression Statement Progressed functional strengthening with standing exercises and stretches to improve flexibilty and strengtheing.  Added forward and side lunges as well as stair training.  Pt able to demonstrate appropriate form with all exercises with min cueing for control, noted visible musculature fatigue with new activities.  No reports of pain through session.  Pt stated she is feeling a lot better following therapy and doesn't know how much longer she will need to continue therapy, pt explained length of time strengthening can take and benefits of HEP and walking more to progress.  Reports she has done 8 flights of 13 steps today and walked for almost a mile.   PT Next Visit Plan Continue with functional strenghteing exercises, increase lateral step up height as able and begin step down training next session.          Problem List Patient Active Problem List   Diagnosis Date Noted  . Mucosal abnormality of stomach   . Rectal bleeding 04/26/2013  . Impaired fasting glucose 11/15/2012  . SCIATICA, ACUTE 03/26/2007  . INCREASED BLOOD PRESSURE 11/19/2006  . CONTUSION, FOREARM 11/19/2006  . CARPAL TUNNEL SYNDROME 02/26/2006  . GERD 02/26/2006  . Osteoarthritis 02/26/2006  . HIP PAIN, RIGHT 02/26/2006  . LOW BACK PAIN 02/26/2006  . Insomnia 02/26/2006   Becky Saxasey Tassie Pollett, LPTA; CBIS [phone]  Juel BurrowCockerham, Briunna Leicht Jo 12/01/2014, 6:29 PM  [facility] [address] [place], [place], [address] Phone: [phone]   Fax:  [phone]  Name: Judy PimpleChristine M Todt MRN: [id number] Date of Birth: Feb 19, 1940

## 2014-12-02 ENCOUNTER — Other Ambulatory Visit: Payer: Self-pay | Admitting: Family Medicine

## 2014-12-07 ENCOUNTER — Ambulatory Visit (HOSPITAL_COMMUNITY): Payer: Medicare Other | Admitting: Physical Therapy

## 2014-12-07 DIAGNOSIS — M7061 Trochanteric bursitis, right hip: Secondary | ICD-10-CM | POA: Diagnosis not present

## 2014-12-07 DIAGNOSIS — M791 Myalgia: Secondary | ICD-10-CM | POA: Diagnosis not present

## 2014-12-07 DIAGNOSIS — R29898 Other symptoms and signs involving the musculoskeletal system: Secondary | ICD-10-CM

## 2014-12-07 DIAGNOSIS — M25651 Stiffness of right hip, not elsewhere classified: Secondary | ICD-10-CM

## 2014-12-07 DIAGNOSIS — M25551 Pain in right hip: Secondary | ICD-10-CM

## 2014-12-07 DIAGNOSIS — R262 Difficulty in walking, not elsewhere classified: Secondary | ICD-10-CM | POA: Diagnosis not present

## 2014-12-07 DIAGNOSIS — M7918 Myalgia, other site: Secondary | ICD-10-CM

## 2014-12-07 NOTE — Therapy (Signed)
[facility] [address] [place], [place], [address] Phone: [phone]   Fax:  [phone]  Physical Therapy Treatment  Patient Details  Name: Katherine Hoffman MRN: [id number] Date of Birth: [birth year] Referring Provider: Janit Pagan  Encounter Date: 12/07/2014      PT End of Session - 12/07/14 1512    Visit Number 4   Number of Visits 10   Date for PT Re-Evaluation 12/28/14   Authorization Type UHC Medicare   Authorization Time Period 11/28/14-01/28/15   Authorization - Visit Number 4   Authorization - Number of Visits 10   PT Start Time 1345   PT Stop Time 1427   PT Time Calculation (min) 42 min   Activity Tolerance Patient tolerated treatment well   Behavior During Therapy [facility] for tasks assessed/performed      Past Medical History  Diagnosis Date  . GERD (gastroesophageal reflux disease)   . Diverticulosis   . Hemorrhoids     Past Surgical History  Procedure Laterality Date  . Colonoscopy   12/23/2005    Dr. Rourk:Few scattered pancolonic diverticula/Remainder of the colonic mucosa appeared normal/normal rectum  . Foot surgery    . Colonoscopy N/A 05/14/2013    Dr.Rourk- colonic diverticulosis. internal hemorrhoids  . Esophagogastroduodenoscopy N/A 07/28/2014    Dr. Jena Gauss: tiny hiatal hernia, abnormal gastric mucosa s/p gastric biopsy revealing chronic gastritis    There were no vitals filed for this visit.  Visit Diagnosis:  Right hip pain  Stiffness of right hip joint  Difficulty walking  Weakness of right leg  Piriformis muscle pain  Trochanteric bursitis of right hip      Subjective Assessment - 12/07/14 1347    Subjective Patient reports that she is doing well with very minimal pain today, had a good weekend    Currently in Pain? No/denies                         [facility] Adult PT Treatment/Exercise - 12/07/14 0001    Knee/Hip Exercises: Stretches   Active Hamstring Stretch Both;3  reps;30 seconds   Active Hamstring Stretch Limitations 12in step   Hip Flexor Stretch Right;2 reps;30 seconds   Hip Flexor Stretch Limitations 12 inch box    Piriformis Stretch Both;2 reps;30 seconds   Gastroc Stretch Both;3 reps;30 seconds   Gastroc Stretch Limitations slant board    Knee/Hip Exercises: Standing   Heel Raises Both;1 set;15 reps   Heel Raises Limitations toe and heel raises    Forward Lunges Both;1 set;10 reps   Forward Lunges Limitations 4 inch box    Side Lunges Both;1 set;10 reps   Side Lunges Limitations 4 inch box    Lateral Step Up Both;1 set;10 reps   Lateral Step Up Limitations 4 inch box    Forward Step Up Both;1 set;10 reps   Forward Step Up Limitations 4 inch box    Step Down Both;1 set;10 reps   Step Down Limitations 4 inch box    Functional Squat 15 reps   Functional Squat Limitations 5 toes neutral, 5 toes in, 5 toes out    Other Standing Knee Exercises 3 D hip excursions 10x   Other Standing Knee Exercises Hip ABD walks 2x24ft                 PT Education - 12/07/14 1512    Education provided No          PT Short Term  Goals - 11/29/14 0830    PT SHORT TERM GOAL #1   Title Pt will be independent in HEP.   Time 2   Period Weeks   Status New   PT SHORT TERM GOAL #2   Title Improve RLE strength to 4-/5 or greater to improve functional mobillity and gait mechanics.    Time 2   Period Weeks   Status New   PT SHORT TERM GOAL #3   Title Pt will complete five time sit to stand in 15 seconds to demonstrate increased power in BLE.    Time 2   Period Weeks   Status New   PT SHORT TERM GOAL #4   Title Pt will ambulate 1200 feet in to demonstrate improved gait speed and functional mobility.    Time 2   Period Weeks   Status New           PT Long Term Goals - 11/29/14 4098    PT LONG TERM GOAL #1   Title Pt will be independent in advanced HEP.   Time 4   Period Weeks   Status New   PT LONG TERM GOAL #2   Title Imiprove  RLE strength to 4+/5 to improve gait mechanics and ability to climb stairs.   Time 4   Period Weeks   Status New   PT LONG TERM GOAL #3   Title Pt will complete five time sit to stand in 12 seconds or less to demonstrate improved power of BLE.   Time 4   Period Weeks   Status New   PT LONG TERM GOAL #4   Title Pt will report that she has walked 3 miles to demonstrate return to PLOF.    Time 4   Period Weeks   Status New               Plan - 12/07/14 1513    Clinical Impression Statement Continued functional strengthening with all closed chain exercises today, also introduced step downs from 4 inch box with fair tolerance from patient. Patient did require significant cues for correct form with squats today and did become fatigued thoughout session. Patient reoprts that she is continuing to walk regularly throughout her day.    Pt will benefit from skilled therapeutic intervention in order to improve on the following deficits Abnormal gait;Decreased activity tolerance;Decreased range of motion;Decreased strength;Difficulty walking;Impaired flexibility;Pain   Rehab Potential Good   PT Frequency 3x / week   PT Duration 4 weeks   PT Treatment/Interventions ADLs/Self Care Home Management;Cryotherapy;Gait training;Stair training;Functional mobility training;Therapeutic activities;Therapeutic exercise;Balance training;Neuromuscular re-education;Patient/family education;Manual techniques;Passive range of motion   PT Next Visit Plan Continue with functional strenghteing exercises, increase lateral step up height as able and begin step down training next session.     Consulted and Agree with Plan of Care Patient        Problem List Patient Active Problem List   Diagnosis Date Noted  . Mucosal abnormality of stomach   . Rectal bleeding 04/26/2013  . Impaired fasting glucose 11/15/2012  . SCIATICA, ACUTE 03/26/2007  . INCREASED BLOOD PRESSURE 11/19/2006  . CONTUSION, FOREARM  11/19/2006  . CARPAL TUNNEL SYNDROME 02/26/2006  . GERD 02/26/2006  . Osteoarthritis 02/26/2006  . HIP PAIN, RIGHT 02/26/2006  . LOW BACK PAIN 02/26/2006  . Insomnia 02/26/2006    Nedra Hai PT, DPT [phone]  [facility] [address] [place], [place], [address] Phone: [phone]   Fax:  [phone]  Name: Katherine Hoffman MRN: [id number] Date of Birth: 24-Jun-1940

## 2014-12-08 ENCOUNTER — Ambulatory Visit (HOSPITAL_COMMUNITY): Payer: Medicare Other | Admitting: Physical Therapy

## 2014-12-08 ENCOUNTER — Telehealth (HOSPITAL_COMMUNITY): Payer: Self-pay | Admitting: Physical Therapy

## 2014-12-08 NOTE — Telephone Encounter (Signed)
Called Ms. Watchman re her missed appointment. She forgot.  Reminded pt about her next appointment on 12/12/2014 at 11:00.    Virgina Organynthia Russell, PT CLT [phone]

## 2014-12-12 ENCOUNTER — Ambulatory Visit (HOSPITAL_COMMUNITY): Payer: Medicare Other | Admitting: Physical Therapy

## 2014-12-12 DIAGNOSIS — M791 Myalgia: Secondary | ICD-10-CM | POA: Diagnosis not present

## 2014-12-12 DIAGNOSIS — R29898 Other symptoms and signs involving the musculoskeletal system: Secondary | ICD-10-CM | POA: Diagnosis not present

## 2014-12-12 DIAGNOSIS — M25651 Stiffness of right hip, not elsewhere classified: Secondary | ICD-10-CM | POA: Diagnosis not present

## 2014-12-12 DIAGNOSIS — R262 Difficulty in walking, not elsewhere classified: Secondary | ICD-10-CM

## 2014-12-12 DIAGNOSIS — M25551 Pain in right hip: Secondary | ICD-10-CM

## 2014-12-12 DIAGNOSIS — M7918 Myalgia, other site: Secondary | ICD-10-CM

## 2014-12-12 DIAGNOSIS — M7061 Trochanteric bursitis, right hip: Secondary | ICD-10-CM | POA: Diagnosis not present

## 2014-12-12 NOTE — Therapy (Signed)
[facility] [address] [place], [place], [address] Phone: [phone]   Fax:  [phone]  Physical Therapy Treatment  Patient Details  Name: Katherine Hoffman MRN: [id number] Date of Birth: [birth year] Referring Provider: Janit PaganA. Kendall  Encounter Date: 12/12/2014      PT End of Session - 12/12/14 1158    Visit Number 5   Number of Visits 10   Date for PT Re-Evaluation 12/28/14   Authorization Type UHC Medicare   Authorization Time Period 11/28/14-01/28/15   Authorization - Visit Number 5   Authorization - Number of Visits 10   PT Start Time 1100   PT Stop Time 1145   PT Time Calculation (min) 45 min   Activity Tolerance Patient tolerated treatment well   Behavior [facility] Therapy [facility] for tasks assessed/performed      Past Medical History  Diagnosis Date  . GERD (gastroesophageal reflux disease)   . Diverticulosis   . Hemorrhoids     Past Surgical History  Procedure Laterality Date  . Colonoscopy   12/23/2005    Dr. Rourk:Few scattered pancolonic diverticula/Remainder of the colonic mucosa appeared normal/normal rectum  . Foot surgery    . Colonoscopy N/A 05/14/2013    Dr.Rourk- colonic diverticulosis. internal hemorrhoids  . Esophagogastroduodenoscopy N/A 07/28/2014    Dr. Jena Gaussourk: tiny hiatal hernia, abnormal gastric mucosa s/p gastric biopsy revealing chronic gastritis    There were no vitals filed for this visit.  Visit Diagnosis:  Right hip pain  Stiffness of right hip joint  Difficulty walking  Weakness of right leg  Piriformis muscle pain      Subjective Assessment - 12/12/14 1103    Subjective Pt reports that she had a bad weekend with a lot of pain. Most of her pain is down the front and back of her RLE today. She rates pain as a 6/10 today.    Currently in Pain? Yes   Pain Score 6                          [facility] Adult PT Treatment/Exercise - 12/12/14 0001    Knee/Hip Exercises:  Stretches   Active Hamstring Stretch Both;3 reps;30 seconds   Active Hamstring Stretch Limitations 14" step   Hip Flexor Stretch 30 seconds;Both;3 reps   Hip Flexor Stretch Limitations 14 inch step   Piriformis Stretch Both;2 reps;30 seconds   Manual Therapy   Manual Therapy Soft tissue mobilization;Myofascial release   Soft tissue mobilization STM to R piriformis, ITB, lateral quads   Myofascial Release TrP release to R lateral quads                PT Education - 12/12/14 1158    Education provided Yes   Education Details Educated on icing and drinking water following TrP release   Person(s) Educated Patient   Methods Explanation   Comprehension Verbalized understanding          PT Short Term Goals - 11/29/14 0830    PT SHORT TERM GOAL #1   Title Pt will be independent in HEP.   Time 2   Period Weeks   Status New   PT SHORT TERM GOAL #2   Title Improve RLE strength to 4-/5 or greater to improve functional mobillity and gait mechanics.    Time 2   Period Weeks   Status New   PT SHORT TERM GOAL #3   Title Pt will complete five time sit  to stand in 15 seconds to demonstrate increased power in BLE.    Time 2   Period Weeks   Status New   PT SHORT TERM GOAL #4   Title Pt will ambulate 1200 feet in to demonstrate improved gait speed and functional mobility.    Time 2   Period Weeks   Status New           PT Long Term Goals - 11/29/14 9811    PT LONG TERM GOAL #1   Title Pt will be independent in advanced HEP.   Time 4   Period Weeks   Status New   PT LONG TERM GOAL #2   Title Imiprove RLE strength to 4+/5 to improve gait mechanics and ability to climb stairs.   Time 4   Period Weeks   Status New   PT LONG TERM GOAL #3   Title Pt will complete five time sit to stand in 12 seconds or less to demonstrate improved power of BLE.   Time 4   Period Weeks   Status New   PT LONG TERM GOAL #4   Title Pt will report that she has walked 3 miles to  demonstrate return to PLOF.    Time 4   Period Weeks   Status New               Plan - 12/12/14 1158    Clinical Impression Statement Pt presented with reports of increased pain today. Treatment session focused on functional strengthening and manual therapy to decrease pain levels. Pt demonstrated increased tightness in R piriformis and R lateral quads. Soft tissue mobilization was performed to piriformis, ITB, and quads, followed by trigger point release to lateral quads. Pt reported decreased pain following manual therapy, and was educated on staying hydrated to decrease muscle soreness.   PT Next Visit Plan Reintroduce functional strengthening, add sidelying hip abduction with tband if tolerated        Problem List Patient Active Problem List   Diagnosis Date Noted  . Mucosal abnormality of stomach   . Rectal bleeding 04/26/2013  . Impaired fasting glucose 11/15/2012  . SCIATICA, ACUTE 03/26/2007  . INCREASED BLOOD PRESSURE 11/19/2006  . CONTUSION, FOREARM 11/19/2006  . CARPAL TUNNEL SYNDROME 02/26/2006  . GERD 02/26/2006  . Osteoarthritis 02/26/2006  . HIP PAIN, RIGHT 02/26/2006  . LOW BACK PAIN 02/26/2006  . Insomnia 02/26/2006    Leona Singleton, PT, DPT [id number] 12/12/2014, 12:01 PM  [facility] [address] [place], [place], [address] Phone: [phone]   Fax:  [phone]  Name: Katherine Hoffman MRN: [id number] Date of Birth: 1940-05-05

## 2014-12-15 ENCOUNTER — Ambulatory Visit (HOSPITAL_COMMUNITY): Payer: Medicare Other | Admitting: Physical Therapy

## 2014-12-15 DIAGNOSIS — R262 Difficulty in walking, not elsewhere classified: Secondary | ICD-10-CM | POA: Diagnosis not present

## 2014-12-15 DIAGNOSIS — M25651 Stiffness of right hip, not elsewhere classified: Secondary | ICD-10-CM | POA: Diagnosis not present

## 2014-12-15 DIAGNOSIS — M7061 Trochanteric bursitis, right hip: Secondary | ICD-10-CM

## 2014-12-15 DIAGNOSIS — R29898 Other symptoms and signs involving the musculoskeletal system: Secondary | ICD-10-CM

## 2014-12-15 DIAGNOSIS — M25551 Pain in right hip: Secondary | ICD-10-CM

## 2014-12-15 DIAGNOSIS — M791 Myalgia: Secondary | ICD-10-CM | POA: Diagnosis not present

## 2014-12-15 DIAGNOSIS — M7918 Myalgia, other site: Secondary | ICD-10-CM

## 2014-12-15 NOTE — Therapy (Signed)
[facility] [address] [place], [place], [address] Phone: [phone]   Fax:  [phone]  Physical Therapy Treatment  Patient Details  Name: Katherine Hoffman MRN: [id number] Date of Birth: [birth year] Referring Provider: Janit Pagan  Encounter Date: 12/15/2014      PT End of Session - 12/15/14 1413    Visit Number 6   Number of Visits 10   Date for PT Re-Evaluation 12/28/14   Authorization Type UHC Medicare   Authorization Time Period 11/28/14-01/28/15   Authorization - Visit Number 6   Authorization - Number of Visits 10   PT Start Time 1308   PT Stop Time 1355   PT Time Calculation (min) 47 min   Activity Tolerance Patient tolerated treatment well   Behavior During Therapy [facility] for tasks assessed/performed      Past Medical History  Diagnosis Date  . GERD (gastroesophageal reflux disease)   . Diverticulosis   . Hemorrhoids     Past Surgical History  Procedure Laterality Date  . Colonoscopy   12/23/2005    Dr. Rourk:Few scattered pancolonic diverticula/Remainder of the colonic mucosa appeared normal/normal rectum  . Foot surgery    . Colonoscopy N/A 05/14/2013    Dr.Rourk- colonic diverticulosis. internal hemorrhoids  . Esophagogastroduodenoscopy N/A 07/28/2014    Dr. Jena Gauss: tiny hiatal hernia, abnormal gastric mucosa s/p gastric biopsy revealing chronic gastritis    There were no vitals filed for this visit.  Visit Diagnosis:  No diagnosis found.      Subjective Assessment - 12/15/14 1333    Subjective Pt states her pain is nothing like it used to but still hurting into her Rt LE (distal quad and into the knee).  States she thinks she did too much yesterday.  States she walked 1 mile and went up and down the steps several times and may have overdone it.  5/10 currently.                          [facility] Adult PT Treatment/Exercise - 12/15/14 0001    Knee/Hip Exercises: Stretches   Active Hamstring  Stretch Both;3 reps;30 seconds   Active Hamstring Stretch Limitations 14" step   Hip Flexor Stretch 30 seconds;Both;3 reps   Hip Flexor Stretch Limitations 14 inch step   Piriformis Stretch Both;2 reps;30 seconds   Gastroc Stretch Both;3 reps;30 seconds   Gastroc Stretch Limitations slant board    Knee/Hip Exercises: Standing   Heel Raises Both;1 set;15 reps   Heel Raises Limitations toe and heel raises    Forward Lunges Both;1 set;10 reps   Forward Lunges Limitations 1 HHA 4 inch box    Side Lunges Both;1 set;10 reps   Side Lunges Limitations 1 HHA 4 inch box    Hip Abduction Both;10 reps   Lateral Step Up Both;1 set;10 reps   Lateral Step Up Limitations 4 inch box    Forward Step Up Both;1 set;10 reps   Forward Step Up Limitations 4 inch box    Step Down Both;1 set;10 reps   Step Down Limitations 4 inch box    Functional Squat 15 reps   Functional Squat Limitations 5 toes neutral, 5 toes in, 5 toes out    Manual Therapy   Manual Therapy Soft tissue mobilization;Myofascial release   Manual therapy comments in Lt sidelying to Rt hip and ITB musculature   Soft tissue mobilization STM to R piriformis, ITB, lateral quads  PT Short Term Goals - 11/29/14 0830    PT SHORT TERM GOAL #1   Title Pt will be independent in HEP.   Time 2   Period Weeks   Status New   PT SHORT TERM GOAL #2   Title Improve RLE strength to 4-/5 or greater to improve functional mobillity and gait mechanics.    Time 2   Period Weeks   Status New   PT SHORT TERM GOAL #3   Title Pt will complete five time sit to stand in 15 seconds to demonstrate increased power in BLE.    Time 2   Period Weeks   Status New   PT SHORT TERM GOAL #4   Title Pt will ambulate 1200 feet in 6MWT to demonstrate improved gait speed and functional mobility.    Time 2   Period Weeks   Status New           PT Long Term Goals - 11/29/14 [id number]    PT LONG TERM GOAL #1   Title Pt will be independent  in advanced HEP.   Time 4   Period Weeks   Status New   PT LONG TERM GOAL #2   Title Imiprove RLE strength to 4+/5 to improve gait mechanics and ability to climb stairs.   Time 4   Period Weeks   Status New   PT LONG TERM GOAL #3   Title Pt will complete five time sit to stand in 12 seconds or less to demonstrate improved power of BLE.   Time 4   Period Weeks   Status New   PT LONG TERM GOAL #4   Title Pt will report that she has walked 3 miles to demonstrate return to PLOF.    Time 4   Period Weeks   Status New               Plan - 12/15/14 1413    Clinical Impression Statement PT wtih increased discomfort today due to increased activity at home.  Added hip abduction in standing and increased reps.   Completed manual to Right piriformis, ITB and lateral quadriceps to decrease tightness and pain.  Pt reported overall improvement at end of session.   PT Next Visit Plan Continue with functional strengthening completing manual PRN.         Problem List Patient Active Problem List   Diagnosis Date Noted  . Mucosal abnormality of stomach   . Rectal bleeding 04/26/2013  . Impaired fasting glucose 11/15/2012  . SCIATICA, ACUTE 03/26/2007  . INCREASED BLOOD PRESSURE 11/19/2006  . CONTUSION, FOREARM 11/19/2006  . CARPAL TUNNEL SYNDROME 02/26/2006  . GERD 02/26/2006  . Osteoarthritis 02/26/2006  . HIP PAIN, RIGHT 02/26/2006  . LOW BACK PAIN 02/26/2006  . Insomnia 02/26/2006    Lurena Nidamy B Frazier, PTA/CLT [phone]  12/15/2014, 2:38 PM  [facility] [address] [place], [place], [address] Phone: [phone]   Fax:  [phone]  Name: Judy PimpleChristine M Mecham MRN: [id number] Date of Birth: [birth year]

## 2014-12-19 ENCOUNTER — Ambulatory Visit (HOSPITAL_COMMUNITY): Payer: Medicare Other | Admitting: Physical Therapy

## 2014-12-19 DIAGNOSIS — M25551 Pain in right hip: Secondary | ICD-10-CM

## 2014-12-19 DIAGNOSIS — M25651 Stiffness of right hip, not elsewhere classified: Secondary | ICD-10-CM

## 2014-12-19 DIAGNOSIS — R29898 Other symptoms and signs involving the musculoskeletal system: Secondary | ICD-10-CM | POA: Diagnosis not present

## 2014-12-19 DIAGNOSIS — R262 Difficulty in walking, not elsewhere classified: Secondary | ICD-10-CM

## 2014-12-19 DIAGNOSIS — M7061 Trochanteric bursitis, right hip: Secondary | ICD-10-CM | POA: Diagnosis not present

## 2014-12-19 DIAGNOSIS — M7918 Myalgia, other site: Secondary | ICD-10-CM

## 2014-12-19 DIAGNOSIS — M791 Myalgia: Secondary | ICD-10-CM | POA: Diagnosis not present

## 2014-12-19 NOTE — Therapy (Signed)
[facility] [address] [place], [place], [address] Phone: [phone]   Fax:  [phone]  Physical Therapy Treatment  Patient Details  Name: LANIQUE GONZALO MRN: [id number] Date of Birth: 09/18/1940 Referring Provider: Janit Pagan  Encounter Date: 12/19/2014      PT End of Session - 12/19/14 1415    Visit Number 7   Number of Visits 10   Date for PT Re-Evaluation 12/28/14   Authorization Type UHC Medicare   Authorization Time Period 11/28/14-01/28/15   Authorization - Visit Number 7   Authorization - Number of Visits 10   PT Start Time 1300   PT Stop Time 1346   PT Time Calculation (min) 46 min   Activity Tolerance Patient tolerated treatment well   Behavior During Therapy [facility] for tasks assessed/performed      Past Medical History  Diagnosis Date  . GERD (gastroesophageal reflux disease)   . Diverticulosis   . Hemorrhoids     Past Surgical History  Procedure Laterality Date  . Colonoscopy   12/23/2005    Dr. Rourk:Few scattered pancolonic diverticula/Remainder of the colonic mucosa appeared normal/normal rectum  . Foot surgery    . Colonoscopy N/A 05/14/2013    Dr.Rourk- colonic diverticulosis. internal hemorrhoids  . Esophagogastroduodenoscopy N/A 07/28/2014    Dr. Jena Gauss: tiny hiatal hernia, abnormal gastric mucosa s/p gastric biopsy revealing chronic gastritis    There were no vitals filed for this visit.  Visit Diagnosis:  Right hip pain  Stiffness of right hip joint  Difficulty walking  Piriformis muscle pain      Subjective Assessment - 12/19/14 1304    Subjective Pt reports that she is having increased pain in her RLE today, she feels that PT has helped to decrease her pain level slighly ,but she is getting discouraged that her pain isn't completely gone by now.    Currently in Pain? Yes   Pain Score 6    Pain Location Leg   Pain Orientation Anterior;Medial;Right                          [facility] Adult PT Treatment/Exercise - 12/19/14 0001    Knee/Hip Exercises: Stretches   Active Hamstring Stretch Both;3 reps;30 seconds   Active Hamstring Stretch Limitations 12in step   Hip Flexor Stretch 30 seconds;Both;3 reps   Hip Flexor Stretch Limitations 12 inch box    Piriformis Stretch Both;2 reps;30 seconds   Piriformis Stretch Limitations seated   Other Knee/Hip Stretches ITB stretch 2x30"   Manual Therapy   Manual Therapy Soft tissue mobilization;Myofascial release   Manual therapy comments in L sidelying and supine following stretching   Soft tissue mobilization STM to R piriformis, ITB, lateral quads   Myofascial Release TrP release to R lateral quads                  PT Short Term Goals - 11/29/14 0830    PT SHORT TERM GOAL #1   Title Pt will be independent in HEP.   Time 2   Period Weeks   Status New   PT SHORT TERM GOAL #2   Title Improve RLE strength to 4-/5 or greater to improve functional mobillity and gait mechanics.    Time 2   Period Weeks   Status New   PT SHORT TERM GOAL #3   Title Pt will complete five time sit to stand in 15 seconds to demonstrate increased power in  BLE.    Time 2   Period Weeks   Status New   PT SHORT TERM GOAL #4   Title Pt will ambulate 1200 feet in 6MWT to demonstrate improved gait speed and functional mobility.    Time 2   Period Weeks   Status New           PT Long Term Goals - 11/29/14 [id number]    PT LONG TERM GOAL #1   Title Pt will be independent in advanced HEP.   Time 4   Period Weeks   Status New   PT LONG TERM GOAL #2   Title Imiprove RLE strength to 4+/5 to improve gait mechanics and ability to climb stairs.   Time 4   Period Weeks   Status New   PT LONG TERM GOAL #3   Title Pt will complete five time sit to stand in 12 seconds or less to demonstrate improved power of BLE.   Time 4   Period Weeks   Status New   PT LONG TERM GOAL #4   Title Pt will report  that she has walked 3 miles to demonstrate return to PLOF.    Time 4   Period Weeks   Status New               Plan - 12/19/14 1415    Clinical Impression Statement Treatment session focused on functional stretching and manual therapy to decrease pain levels and improve gait mechanics. Pt demonstrates significant trigger points in lateral quads and increased tightness in ITB. Myofascial release and soft tissue mobilization were completed to R hip musculature in order to address restrictions, pt reported decreased pain levels post treatment. Pt was educated on staying hydrated to avoid muscle soreness and headaches.    PT Next Visit Plan Manual PRN, continue with functional strengthening        Problem List Patient Active Problem List   Diagnosis Date Noted  . Mucosal abnormality of stomach   . Rectal bleeding 04/26/2013  . Impaired fasting glucose 11/15/2012  . SCIATICA, ACUTE 03/26/2007  . INCREASED BLOOD PRESSURE 11/19/2006  . CONTUSION, FOREARM 11/19/2006  . CARPAL TUNNEL SYNDROME 02/26/2006  . GERD 02/26/2006  . Osteoarthritis 02/26/2006  . HIP PAIN, RIGHT 02/26/2006  . LOW BACK PAIN 02/26/2006  . Insomnia 02/26/2006    Leona SingletonLauren Evin Loiseau, PT, DPT [phone] 12/19/2014, 2:18 PM  [facility] [address] [place], [place], [address] Phone: [phone]   Fax:  [phone]  Name: Judy PimpleChristine M Shanahan MRN: [id number] Date of Birth: 1940-11-18

## 2014-12-21 ENCOUNTER — Ambulatory Visit (HOSPITAL_COMMUNITY): Payer: Medicare Other

## 2014-12-21 DIAGNOSIS — M25551 Pain in right hip: Secondary | ICD-10-CM

## 2014-12-21 DIAGNOSIS — R29898 Other symptoms and signs involving the musculoskeletal system: Secondary | ICD-10-CM

## 2014-12-21 DIAGNOSIS — M7061 Trochanteric bursitis, right hip: Secondary | ICD-10-CM | POA: Diagnosis not present

## 2014-12-21 DIAGNOSIS — M25651 Stiffness of right hip, not elsewhere classified: Secondary | ICD-10-CM

## 2014-12-21 DIAGNOSIS — R262 Difficulty in walking, not elsewhere classified: Secondary | ICD-10-CM | POA: Diagnosis not present

## 2014-12-21 DIAGNOSIS — M7918 Myalgia, other site: Secondary | ICD-10-CM

## 2014-12-21 DIAGNOSIS — M791 Myalgia: Secondary | ICD-10-CM | POA: Diagnosis not present

## 2014-12-21 NOTE — Therapy (Signed)
[facility] [address] [place], [place], [address] Phone: [phone]   Fax:  [phone]  Physical Therapy Treatment  Patient Details  Name: Katherine Hoffman MRN: [id number] Date of Birth: Feb 04, 1940 Referring Provider: Janit Pagan  Encounter Date: 12/21/2014      PT End of Session - 12/21/14 1702    Visit Number 8   Number of Visits 10   Date for PT Re-Evaluation 12/28/14   Authorization Type UHC Medicare   Authorization Time Period 11/28/14-01/28/15   Authorization - Visit Number 8   Authorization - Number of Visits 10   PT Start Time 1645   PT Stop Time 1730   PT Time Calculation (min) 45 min   Activity Tolerance Patient tolerated treatment well   Behavior During Therapy [facility] for tasks assessed/performed      Past Medical History  Diagnosis Date  . GERD (gastroesophageal reflux disease)   . Diverticulosis   . Hemorrhoids     Past Surgical History  Procedure Laterality Date  . Colonoscopy   12/23/2005    Dr. Rourk:Few scattered pancolonic diverticula/Remainder of the colonic mucosa appeared normal/normal rectum  . Foot surgery    . Colonoscopy N/A 05/14/2013    Dr.Rourk- colonic diverticulosis. internal hemorrhoids  . Esophagogastroduodenoscopy N/A 07/28/2014    Dr. Jena Gauss: tiny hiatal hernia, abnormal gastric mucosa s/p gastric biopsy revealing chronic gastritis    There were no vitals filed for this visit.  Visit Diagnosis:  Right hip pain  Stiffness of right hip joint  Difficulty walking  Piriformis muscle pain  Weakness of right leg  Trochanteric bursitis of right hip      Subjective Assessment - 12/21/14 1642    Subjective Pt stated RLE pain scale 4/10, hip is feeling good today.  Reports she has been trigger point techniques on Rt hip and stated it feels much better.   Currently in Pain? Yes   Pain Score 4    Pain Location Leg   Pain Orientation Right;Anterior;Medial   Pain Descriptors /  Indicators Dull;Aching           [facility] Adult PT Treatment/Exercise - 12/21/14 0001    Knee/Hip Exercises: Stretches   Active Hamstring Stretch Both;3 reps;30 seconds   Active Hamstring Stretch Limitations 12in step   Hip Flexor Stretch 30 seconds;Both;3 reps   Hip Flexor Stretch Limitations 12 inch box    Piriformis Stretch Both;3 reps;30 seconds   Other Knee/Hip Stretches ITB stretch 2x30"   Knee/Hip Exercises: Standing   Forward Lunges Both;1 set;10 reps   Forward Lunges Limitations 1 HHA 4 inch box    Side Lunges Both;1 set;10 reps   Side Lunges Limitations 1 HHA 4 inch box    Hip Abduction Both;10 reps   Functional Squat 15 reps   Manual Therapy   Manual Therapy Soft tissue mobilization;Myofascial release   Manual therapy comments in L sidelying and supine following stretching   Soft tissue mobilization STM to R piriformis, ITB, lateral quads                  PT Short Term Goals - 11/29/14 0830    PT SHORT TERM GOAL #1   Title Pt will be independent in HEP.   Time 2   Period Weeks   Status New   PT SHORT TERM GOAL #2   Title Improve RLE strength to 4-/5 or greater to improve functional mobillity and gait mechanics.    Time 2   Period  Weeks   Status New   PT SHORT TERM GOAL #3   Title Pt will complete five time sit to stand in 15 seconds to demonstrate increased power in BLE.    Time 2   Period Weeks   Status New   PT SHORT TERM GOAL #4   Title Pt will ambulate 1200 feet in 6MWT to demonstrate improved gait speed and functional mobility.    Time 2   Period Weeks   Status New           PT Long Term Goals - 11/29/14 16100833    PT LONG TERM GOAL #1   Title Pt will be independent in advanced HEP.   Time 4   Period Weeks   Status New   PT LONG TERM GOAL #2   Title Imiprove RLE strength to 4+/5 to improve gait mechanics and ability to climb stairs.   Time 4   Period Weeks   Status New   PT LONG TERM GOAL #3   Title Pt will complete five time sit  to stand in 12 seconds or less to demonstrate improved power of BLE.   Time 4   Period Weeks   Status New   PT LONG TERM GOAL #4   Title Pt will report that she has walked 3 miles to demonstrate return to PLOF.    Time 4   Period Weeks   Status New               Plan - 12/21/14 1731    Clinical Impression Statement Resumed functional strengthening exercises with min cueing required for proper form and technique.  Pt able to complete all exercises with no reports of increased pain thouigh did comment on discomfort to Rt piriforms with side lunges which was resolved folowing exercise.  Ended session with manual techniques to reduce tightness Rt piriformis, ITB, lateral quadriceps, resolved 1 trigger point Rt piriformis with reports of relief following.  Pt stated pain free at end of session.    PT Next Visit Plan Manual PRN, continue with functional strengthening        Problem List Patient Active Problem List   Diagnosis Date Noted  . Mucosal abnormality of stomach   . Rectal bleeding 04/26/2013  . Impaired fasting glucose 11/15/2012  . SCIATICA, ACUTE 03/26/2007  . INCREASED BLOOD PRESSURE 11/19/2006  . CONTUSION, FOREARM 11/19/2006  . CARPAL TUNNEL SYNDROME 02/26/2006  . GERD 02/26/2006  . Osteoarthritis 02/26/2006  . HIP PAIN, RIGHT 02/26/2006  . LOW BACK PAIN 02/26/2006  . Insomnia 02/26/2006   Becky Saxasey Cockerham, LPTA; CBIS [phone]  Juel BurrowCockerham, Casey Jo 12/21/2014, 5:55 PM  [place] [facility] [address] [place], [place], [address] Phone: [phone]   Fax:  [phone]  Name: Judy PimpleChristine M Swords MRN: [id number] Date of Birth: [birth year]

## 2014-12-26 ENCOUNTER — Encounter (HOSPITAL_COMMUNITY): Payer: Medicare Other | Admitting: Physical Therapy

## 2014-12-26 DIAGNOSIS — M25551 Pain in right hip: Secondary | ICD-10-CM | POA: Diagnosis not present

## 2014-12-26 DIAGNOSIS — M5441 Lumbago with sciatica, right side: Secondary | ICD-10-CM | POA: Diagnosis not present

## 2014-12-28 ENCOUNTER — Ambulatory Visit (HOSPITAL_COMMUNITY): Payer: Medicare Other | Admitting: Physical Therapy

## 2014-12-28 DIAGNOSIS — R29898 Other symptoms and signs involving the musculoskeletal system: Secondary | ICD-10-CM | POA: Diagnosis not present

## 2014-12-28 DIAGNOSIS — R262 Difficulty in walking, not elsewhere classified: Secondary | ICD-10-CM | POA: Diagnosis not present

## 2014-12-28 DIAGNOSIS — M25651 Stiffness of right hip, not elsewhere classified: Secondary | ICD-10-CM | POA: Diagnosis not present

## 2014-12-28 DIAGNOSIS — M25551 Pain in right hip: Secondary | ICD-10-CM | POA: Diagnosis not present

## 2014-12-28 DIAGNOSIS — M7061 Trochanteric bursitis, right hip: Secondary | ICD-10-CM | POA: Diagnosis not present

## 2014-12-28 DIAGNOSIS — M791 Myalgia: Secondary | ICD-10-CM | POA: Diagnosis not present

## 2014-12-28 NOTE — Therapy (Signed)
[place], [place], [address] Phone: [phone]   Fax:  [phone]  Physical Therapy Treatment  Patient Details  Name: Katherine Hoffman MRN: [id number] Date of Birth: [birth year] Referring Provider: Sandi Carne  Encounter Date: 12/28/2014      PT End of Session - 12/28/14 1542    Visit Number 9   Number of Visits 12   Date for PT Re-Evaluation 01/27/15   Authorization Type UHC Medicare   Authorization Time Period 11/28/14-01/28/15   Authorization - Visit Number 9   Authorization - Number of Visits 19   PT Start Time 1346   PT Stop Time 1430   PT Time Calculation (min) 44 min   Activity Tolerance Patient tolerated treatment well   Behavior During Therapy [facility] for tasks assessed/performed      Past Medical History  Diagnosis Date  . GERD (gastroesophageal reflux disease)   . Diverticulosis   . Hemorrhoids     Past Surgical History  Procedure Laterality Date  . Colonoscopy   12/23/2005    Dr. Rourk:Few scattered pancolonic diverticula/Remainder of the colonic mucosa appeared normal/normal rectum  . Foot surgery    . Colonoscopy N/A 05/14/2013    Dr.Rourk- colonic diverticulosis. internal hemorrhoids  . Esophagogastroduodenoscopy N/A 07/28/2014    Dr. Gala Romney: tiny hiatal hernia, abnormal gastric mucosa s/p gastric biopsy revealing chronic gastritis    There were no vitals filed for this visit.  Visit Diagnosis:  Right hip pain  Stiffness of right hip joint  Difficulty walking  Weakness of right leg      Subjective Assessment - 12/28/14 1351    Subjective Pt reports that she has had much less pain in her leg since beginning PT, she is able to walk better, and she is able to move around with less pain. She reports that her R leg still feels weak, and she still gets some pain in the leg.    How long can you sit comfortably? an hour   How long can you stand comfortably? no limitations   How long  can you walk comfortably? 15 minutes or more   Currently in Pain? Yes   Pain Score 1    Pain Location Leg            [facility] PT Assessment - 12/28/14 0001    Observation/Other Assessments   Focus on Therapeutic Outcomes (FOTO)  33% limited   AROM   Right Hip External Rotation  37   Right Hip Internal Rotation  40   Left Hip External Rotation  31   Left Hip Internal Rotation  45   Strength   Right Hip Flexion 3+/5   Right Hip Extension 4-/5   Right Hip ABduction 4/5   Left Hip Flexion 4+/5   Left Hip Extension 4/5   Left Hip ABduction 4+/5   Right Knee Flexion 4+/5   Right Knee Extension 4+/5   Left Knee Flexion 4+/5   Left Knee Extension 5/5   Transfers   Five time sit to stand comments  10.45"   6 minute walk test results    Aerobic Endurance Distance Walked 1496                     [facility] Adult PT Treatment/Exercise - 12/28/14 0001    Knee/Hip Exercises: Stretches   Active Hamstring Stretch Both;3 reps;30 seconds   Active Hamstring Stretch Limitations 12in step   Piriformis Stretch Both;3 reps;30 seconds  Manual Therapy   Manual Therapy Soft tissue mobilization;Myofascial release   Manual therapy comments in supine following therex and assessment   Soft tissue mobilization STM to R piriformis, ITB, lateral quads                  PT Short Term Goals - 2015-01-18 1411    PT SHORT TERM GOAL #1   Title Pt will be independent in HEP.   Time 2   Period Weeks   Status Achieved   PT SHORT TERM GOAL #2   Title Improve RLE strength to 4-/5 or greater to improve functional mobillity and gait mechanics.    Time 2   Period Weeks   Status Partially Met   PT SHORT TERM GOAL #3   Title Pt will complete five time sit to stand in 15 seconds to demonstrate increased power in BLE.    Time 2   Period Weeks   Status Achieved   PT SHORT TERM GOAL #4   Title Pt will ambulate 1200 feet in 6MWT to demonstrate improved gait speed and functional mobility.     Time 2   Period Weeks   Status Achieved           PT Long Term Goals - 01/18/15 1420    PT LONG TERM GOAL #1   Title Pt will be independent in advanced HEP.   Time 4   Period Weeks   Status On-going   PT LONG TERM GOAL #2   Title Imiprove RLE strength to 4+/5 to improve gait mechanics and ability to climb stairs.   Time 4   Period Weeks   Status On-going   PT LONG TERM GOAL #3   Title Pt will complete five time sit to stand in 12 seconds or less to demonstrate improved power of BLE.   Time 4   Period Weeks   Status On-going   PT LONG TERM GOAL #4   Title Pt will report that she has walked 3 miles to demonstrate return to PLOF.    Time 4   Period Weeks   Status On-going               Plan - 2015-01-18 1546    Clinical Impression Statement Reassessment was completed today. Pt has made excellent progress towards her LTGs, demonstrating increased BLE strength, improved gait speed, improved functional activity tolerance, and decreased pain levels. Pt reports that she would like to take a couple of weeks off from PT to see if her symptoms continue to decrease. Pt will be put on hold for 2 weeks, and she will then call to schedule another appointment.    PT Treatment/Interventions ADLs/Self Care Home Management;Cryotherapy;Gait training;Stair training;Functional mobility training;Therapeutic activities;Therapeutic exercise;Balance training;Neuromuscular re-education;Patient/family education;Manual techniques;Passive range of motion   PT Next Visit Plan Put on hold for 2 weeks, continue with functional strengthening and manual therapy when pt returns.           G-Codes - 2015-01-18 1556    Functional Assessment Tool Used FOTO   Functional Limitation Mobility: Walking and moving around   Mobility: Walking and Moving Around Current Status [phone]) At least 40 percent but less than 60 percent impaired, limited or restricted   Mobility: Walking and Moving Around Goal Status  [phone]) At least 40 percent but less than 60 percent impaired, limited or restricted      Problem List Patient Active Problem List   Diagnosis Date Noted  . Mucosal abnormality of stomach   .  Rectal bleeding 04/26/2013  . Impaired fasting glucose 11/15/2012  . SCIATICA, ACUTE 03/26/2007  . INCREASED BLOOD PRESSURE 11/19/2006  . CONTUSION, FOREARM 11/19/2006  . CARPAL TUNNEL SYNDROME 02/26/2006  . GERD 02/26/2006  . Osteoarthritis 02/26/2006  . HIP PAIN, RIGHT 02/26/2006  . LOW BACK PAIN 02/26/2006  . Insomnia 02/26/2006    Hilma Favors, PT, DPT [phone] 12/28/2014, 3:57 PM  [facility] [address] [place], [place], [address] Phone: [phone]   Fax:  [phone]  Name: Katherine Hoffman MRN: [id number] Date of Birth: September 17, 1940

## 2015-01-09 ENCOUNTER — Other Ambulatory Visit: Payer: Self-pay | Admitting: Family Medicine

## 2015-01-11 ENCOUNTER — Ambulatory Visit (HOSPITAL_COMMUNITY): Payer: Medicare Other | Attending: Sports Medicine | Admitting: Physical Therapy

## 2015-01-11 DIAGNOSIS — R29898 Other symptoms and signs involving the musculoskeletal system: Secondary | ICD-10-CM | POA: Insufficient documentation

## 2015-01-11 DIAGNOSIS — M25551 Pain in right hip: Secondary | ICD-10-CM

## 2015-01-11 DIAGNOSIS — M25651 Stiffness of right hip, not elsewhere classified: Secondary | ICD-10-CM | POA: Diagnosis not present

## 2015-01-11 DIAGNOSIS — R262 Difficulty in walking, not elsewhere classified: Secondary | ICD-10-CM

## 2015-01-11 NOTE — Patient Instructions (Signed)
Iliotibial Band Stretch, Standing    Stand, hands on hips, one leg crossed in front of other leg. Lean to same side as front leg until stretch is felt on other hip. Hold ___ seconds. Change foot position and lean to same side. Hold ___ seconds. Repeat ___ times per session. Do ___ sessions per day.  Copyright  VHI. All rights reserved.   

## 2015-01-11 NOTE — Therapy (Signed)
[place], [place], [address] Phone: [phone]   Fax:  [phone]  Physical Therapy Treatment  Patient Details  Name: Katherine Hoffman MRN: [id number] Date of Birth: 13-Jun-1940 Referring Provider: Sandi Carne  Encounter Date: 01/11/2015      PT End of Session - 01/11/15 1137    Visit Number 10   Number of Visits 12   Date for PT Re-Evaluation 01/27/15   Authorization Type UHC Medicare   Authorization Time Period 11/28/14-01/28/15   Authorization - Visit Number 10   Authorization - Number of Visits 19   PT Start Time 1100   PT Stop Time 5188  pt requested to leave early to get back to hospital    PT Time Calculation (min) 35 min   Activity Tolerance Patient tolerated treatment well   Behavior During Therapy [facility] for tasks assessed/performed      Past Medical History  Diagnosis Date  . GERD (gastroesophageal reflux disease)   . Diverticulosis   . Hemorrhoids     Past Surgical History  Procedure Laterality Date  . Colonoscopy   12/23/2005    Dr. Rourk:Few scattered pancolonic diverticula/Remainder of the colonic mucosa appeared normal/normal rectum  . Foot surgery    . Colonoscopy N/A 05/14/2013    Dr.Rourk- colonic diverticulosis. internal hemorrhoids  . Esophagogastroduodenoscopy N/A 07/28/2014    Dr. Gala Romney: tiny hiatal hernia, abnormal gastric mucosa s/p gastric biopsy revealing chronic gastritis    There were no vitals filed for this visit.  Visit Diagnosis:  Right hip pain  Stiffness of right hip joint  Difficulty walking  Weakness of right leg      Subjective Assessment - 01/11/15 1106    Subjective Pt reports that she has been feeling pretty good since her last treatment. She has been in the hospital a lot recently visitng a friend, and has been doing a lot of sitting. She has not been able to walk as much as she would like, and her back has been feeling a little stiff.    Currently  in Pain? No/denies   Pain Score 0-No pain                         [facility] Adult PT Treatment/Exercise - 01/11/15 0001    Knee/Hip Exercises: Stretches   Active Hamstring Stretch Both;3 reps;30 seconds   Active Hamstring Stretch Limitations 14" step   Piriformis Stretch Both;3 reps;30 seconds   Other Knee/Hip Stretches ITB stretch 2x30"   Knee/Hip Exercises: Standing   Forward Lunges Both;1 set;10 reps   Forward Lunges Limitations into // bars   Side Lunges Both;10 reps   Side Lunges Limitations on floor   Hip Abduction Both;10 reps   Hip Extension 10 reps   Lateral Step Up Both;1 set;10 reps;Step Height: 6"   Forward Step Up Both;1 set;10 reps;Step Height: 6"   Other Standing Knee Exercises sidestepping GTB x 10                  PT Short Term Goals - 12/28/14 1411    PT SHORT TERM GOAL #1   Title Pt will be independent in HEP.   Time 2   Period Weeks   Status Achieved   PT SHORT TERM GOAL #2   Title Improve RLE strength to 4-/5 or greater to improve functional mobillity and gait mechanics.    Time 2   Period Weeks   Status Partially  Met   PT SHORT TERM GOAL #3   Title Pt will complete five time sit to stand in 15 seconds to demonstrate increased power in BLE.    Time 2   Period Weeks   Status Achieved   PT SHORT TERM GOAL #4   Title Pt will ambulate 1200 feet in 6MWT to demonstrate improved gait speed and functional mobility.    Time 2   Period Weeks   Status Achieved           PT Long Term Goals - 12/28/14 1420    PT LONG TERM GOAL #1   Title Pt will be independent in advanced HEP.   Time 4   Period Weeks   Status On-going   PT LONG TERM GOAL #2   Title Imiprove RLE strength to 4+/5 to improve gait mechanics and ability to climb stairs.   Time 4   Period Weeks   Status On-going   PT LONG TERM GOAL #3   Title Pt will complete five time sit to stand in 12 seconds or less to demonstrate improved power of BLE.   Time 4   Period  Weeks   Status On-going   PT LONG TERM GOAL #4   Title Pt will report that she has walked 3 miles to demonstrate return to PLOF.    Time 4   Period Weeks   Status On-going               Plan - 01/11/15 1138    Clinical Impression Statement Pt presented to PT with reports of decreased hip and leg pain. Treatment session focused on functional strengthening in order to advance her HEP in preparation for discharge. Pt was able to complete sidestepping with green theraband with no reports of increased pain, but required min A for 3 episodes of LOB. Pt required verbal and visual cueing to complete IT band stretch properly, and was given HEP for the stretch.     PT Next Visit Plan Reassess with possible d/c next treatment.         Problem List Patient Active Problem List   Diagnosis Date Noted  . Mucosal abnormality of stomach   . Rectal bleeding 04/26/2013  . Impaired fasting glucose 11/15/2012  . SCIATICA, ACUTE 03/26/2007  . INCREASED BLOOD PRESSURE 11/19/2006  . CONTUSION, FOREARM 11/19/2006  . CARPAL TUNNEL SYNDROME 02/26/2006  . GERD 02/26/2006  . Osteoarthritis 02/26/2006  . HIP PAIN, RIGHT 02/26/2006  . LOW BACK PAIN 02/26/2006  . Insomnia 02/26/2006    Hilma Favors, PT, DPT [phone] 01/11/2015, 12:10 PM  [facility] [address] [place], [place], [address] Phone: [phone]   Fax:  [phone]  Name: Katherine Hoffman MRN: [id number] Date of Birth: 1940-10-22

## 2015-01-12 ENCOUNTER — Ambulatory Visit (HOSPITAL_COMMUNITY): Payer: Medicare Other | Admitting: Physical Therapy

## 2015-01-12 DIAGNOSIS — R262 Difficulty in walking, not elsewhere classified: Secondary | ICD-10-CM | POA: Diagnosis not present

## 2015-01-12 DIAGNOSIS — M25551 Pain in right hip: Secondary | ICD-10-CM | POA: Diagnosis not present

## 2015-01-12 DIAGNOSIS — R29898 Other symptoms and signs involving the musculoskeletal system: Secondary | ICD-10-CM | POA: Diagnosis not present

## 2015-01-12 DIAGNOSIS — M25651 Stiffness of right hip, not elsewhere classified: Secondary | ICD-10-CM | POA: Diagnosis not present

## 2015-01-12 NOTE — Therapy (Signed)
[place], [place], [address] Phone: [phone]   Fax:  [phone]  Physical Therapy Treatment  Patient Details  Name: Katherine Hoffman MRN: [id number] Date of Birth: [birth year] Referring Provider: Sandi Carne  Encounter Date: 01/12/2015      PT End of Session - 01/12/15 1343    Visit Number 11   Number of Visits 12   Date for PT Re-Evaluation 01/27/15   Authorization Type UHC Medicare   Authorization Time Period 11/28/14-01/28/15   Authorization - Visit Number 11   Authorization - Number of Visits 19   PT Start Time 1300   PT Stop Time 1342   PT Time Calculation (min) 42 min   Activity Tolerance Patient tolerated treatment well   Behavior During Therapy [facility] for tasks assessed/performed      Past Medical History  Diagnosis Date  . GERD (gastroesophageal reflux disease)   . Diverticulosis   . Hemorrhoids     Past Surgical History  Procedure Laterality Date  . Colonoscopy   12/23/2005    Dr. Rourk:Few scattered pancolonic diverticula/Remainder of the colonic mucosa appeared normal/normal rectum  . Foot surgery    . Colonoscopy N/A 05/14/2013    Dr.Rourk- colonic diverticulosis. internal hemorrhoids  . Esophagogastroduodenoscopy N/A 07/28/2014    Dr. Gala Romney: tiny hiatal hernia, abnormal gastric mucosa s/p gastric biopsy revealing chronic gastritis    There were no vitals filed for this visit.  Visit Diagnosis:  Right hip pain  Difficulty walking  Weakness of right leg      Subjective Assessment - 01/12/15 1304    Subjective Pt reports that she has been feeling much better lately, she got a great night's sleep last night. She denies having any pain today.    How long can you sit comfortably? no limitations   How long can you stand comfortably? no limitations   How long can you walk comfortably? a mile or farther   Currently in Pain? No/denies   Pain Score 0-No pain            [facility] PT  Assessment - 01/12/15 0001    Observation/Other Assessments   Focus on Therapeutic Outcomes (FOTO)  2% limited   Strength   Right Hip Flexion 4+/5   Right Hip Extension 4/5   Right Hip ABduction 4+/5   Left Hip Flexion 4+/5   Left Hip Extension 4/5   Left Hip ABduction 4+/5   Right Knee Flexion 4+/5   Right Knee Extension 5/5   Left Knee Flexion 4+/5   Left Knee Extension 5/5   Transfers   Five time sit to stand comments  7.30"   6 minute walk test results    Aerobic Endurance Distance Walked 1500                     [facility] Adult PT Treatment/Exercise - 01/12/15 0001    Knee/Hip Exercises: Stretches   Active Hamstring Stretch Both;3 reps;30 seconds   Active Hamstring Stretch Limitations 12in step   Piriformis Stretch Both;3 reps;30 seconds   Knee/Hip Exercises: Standing   Forward Lunges Both;1 set;10 reps   Forward Lunges Limitations into // bars   Side Lunges Both;10 reps   Side Lunges Limitations on floor   Lateral Step Up Both;1 set;Step Height: 6";15 reps   Forward Step Up Both;1 set;Step Height: 6";15 reps                PT Education -  01/12/15 1343    Education provided Yes   Education Details goals and HEP reviewed   Person(s) Educated Patient   Methods Explanation;Handout   Comprehension Verbalized understanding;Returned demonstration          PT Short Term Goals - 01/12/15 1434    PT SHORT TERM GOAL #1   Title Pt will be independent in HEP.   Time 2   Period Weeks   Status Achieved   PT SHORT TERM GOAL #2   Title Improve RLE strength to 4-/5 or greater to improve functional mobillity and gait mechanics.    Time 2   Period Weeks   Status Achieved   PT SHORT TERM GOAL #3   Title Pt will complete five time sit to stand in 15 seconds to demonstrate increased power in BLE.    Time 2   Period Weeks   Status Achieved   PT SHORT TERM GOAL #4   Title Pt will ambulate 1200 feet in 6MWT to demonstrate improved gait speed and functional  mobility.    Time 2   Period Weeks   Status Achieved           PT Long Term Goals - 01/12/15 1435    PT LONG TERM GOAL #1   Title Pt will be independent in advanced HEP.   Time 4   Period Weeks   Status Achieved   PT LONG TERM GOAL #2   Title Imiprove RLE strength to 4+/5 to improve gait mechanics and ability to climb stairs.   Time 4   Period Weeks   Status Partially Met   PT LONG TERM GOAL #3   Title Pt will complete five time sit to stand in 12 seconds or less to demonstrate improved power of BLE.   Time 4   Period Weeks   Status Achieved   PT LONG TERM GOAL #4   Title Pt will report that she has walked 3 miles to demonstrate return to PLOF.    Time 4   Period Weeks   Status Partially Met               Plan - 01/12/15 1431    Clinical Impression Statement Reassessment was completed today. Pt has made excellent progress in PT, demonstrating improvements in BLE strength, functional activity tolerance, gait speed, pain levels, and functional mobility. She has met her LTGs and is no longer experiencing any functional limitations. Pt is being discharged to continue independently with HEP.    PT Treatment/Interventions ADLs/Self Care Home Management;Cryotherapy;Gait training;Stair training;Functional mobility training;Therapeutic activities;Therapeutic exercise;Balance training;Neuromuscular re-education;Patient/family education;Manual techniques;Passive range of motion   PT Next Visit Plan Pt discharged to HEP        Problem List Patient Active Problem List   Diagnosis Date Noted  . Mucosal abnormality of stomach   . Rectal bleeding 04/26/2013  . Impaired fasting glucose 11/15/2012  . SCIATICA, ACUTE 03/26/2007  . INCREASED BLOOD PRESSURE 11/19/2006  . CONTUSION, FOREARM 11/19/2006  . CARPAL TUNNEL SYNDROME 02/26/2006  . GERD 02/26/2006  . Osteoarthritis 02/26/2006  . HIP PAIN, RIGHT 02/26/2006  . LOW BACK PAIN 02/26/2006  . Insomnia 02/26/2006     PHYSICAL THERAPY DISCHARGE SUMMARY  Visits from Start of Care: 11  Current functional level related to goals / functional outcomes: Pt demonstrates improved BLE strength, gait speed, functional activity tolerance, and functional mobility.    Remaining deficits: Pt is still working towards walking 3 miles.    Education / Equipment: HEP  Plan: Patient agrees to discharge.  Patient goals were partially met. Patient is being discharged due to meeting the stated rehab goals.  ?????       Hilma Favors, PT, DPT [phone] 01/12/2015, 3:13 PM  [facility] [address] [place], [place], [address] Phone: [phone]   Fax:  [phone]  Name: Katherine Hoffman MRN: [id number] Date of Birth: Jun 06, 1940

## 2015-01-12 NOTE — Patient Instructions (Signed)
Forward Lunge    Standing with feet shoulder width apart and stomach tight, step forward with left leg. Repeat ____ times per set. Do ____ sets per session. Do ____ sessions per day.  http://orth.exer.us/1147   Copyright  VHI. All rights reserved.  Lunge (Lateral)    Hold __ pound ball in front of chest. Lunge to side on left leg. Repeat __ times. Repeat with other leg for set. Rest __ seconds after set. Do __ sets per session.  Copyright  VHI. All rights reserved.  Step-Up: Forward    Step up forward. Keep pelvis level and spine neutral. Do ___ times, each leg, ___ times per day.  http://ss.exer.us/175   Copyright  VHI. All rights reserved.  Step-Up: Lateral    Step up to side with right leg. Bring other foot up onto ____ inch step. Return to floor position with left leg. Repeat ____ times per session. Do ____ sessions per day. Repeat in dimly lit room. Repeat with eyes closed.  Copyright  VHI. All rights reserved.  AMBULATION: Side Step    Step sideways. Repeat in opposite direction. ___ reps per set, ___ sets per day, ___ days per week Use assistive device. Walk to tempo of metronome or music.  Copyright  VHI. All rights reserved.

## 2015-02-01 DIAGNOSIS — M5441 Lumbago with sciatica, right side: Secondary | ICD-10-CM | POA: Diagnosis not present

## 2015-02-01 DIAGNOSIS — M7541 Impingement syndrome of right shoulder: Secondary | ICD-10-CM | POA: Diagnosis not present

## 2015-02-01 DIAGNOSIS — M7542 Impingement syndrome of left shoulder: Secondary | ICD-10-CM | POA: Diagnosis not present

## 2015-02-01 DIAGNOSIS — M25551 Pain in right hip: Secondary | ICD-10-CM | POA: Diagnosis not present

## 2015-02-09 ENCOUNTER — Telehealth: Payer: Self-pay | Admitting: Family Medicine

## 2015-02-09 DIAGNOSIS — Z13220 Encounter for screening for lipoid disorders: Secondary | ICD-10-CM

## 2015-02-09 DIAGNOSIS — Z79899 Other long term (current) drug therapy: Secondary | ICD-10-CM

## 2015-02-09 NOTE — Telephone Encounter (Signed)
Pt is requesting lab orders to be sent over. Last labs per epic were: lipid,hepatic and bmp on 01/31/14

## 2015-02-10 DIAGNOSIS — Z79899 Other long term (current) drug therapy: Secondary | ICD-10-CM | POA: Diagnosis not present

## 2015-02-10 DIAGNOSIS — Z13220 Encounter for screening for lipoid disorders: Secondary | ICD-10-CM | POA: Diagnosis not present

## 2015-02-10 NOTE — Telephone Encounter (Signed)
Notified patient that bloodwork has been ordered.  

## 2015-02-10 NOTE — Telephone Encounter (Signed)
Rep same 

## 2015-02-11 LAB — BASIC METABOLIC PANEL
BUN / CREAT RATIO: 9 — AB (ref 11–26)
BUN: 7 mg/dL — ABNORMAL LOW (ref 8–27)
CHLORIDE: 97 mmol/L (ref 96–106)
CO2: 24 mmol/L (ref 18–29)
Calcium: 10.3 mg/dL (ref 8.7–10.3)
Creatinine, Ser: 0.82 mg/dL (ref 0.57–1.00)
GFR calc Af Amer: 82 mL/min/{1.73_m2} (ref >59)
GFR calc non Af Amer: 71 mL/min/{1.73_m2} (ref >59)
GLUCOSE: 165 mg/dL — AB (ref 65–99)
Potassium: 4.6 mmol/L (ref 3.5–5.2)
SODIUM: 142 mmol/L (ref 134–144)

## 2015-02-11 LAB — LIPID PANEL
CHOLESTEROL TOTAL: 214 mg/dL — AB (ref 100–199)
Chol/HDL Ratio: 5.8 ratio units — ABNORMAL HIGH (ref 0.0–4.4)
HDL: 37 mg/dL — AB (ref >39)
LDL Calculated: 111 mg/dL — ABNORMAL HIGH (ref 0–99)
Triglycerides: 329 mg/dL — ABNORMAL HIGH (ref 0–149)
VLDL Cholesterol Cal: 66 mg/dL — ABNORMAL HIGH (ref 5–40)

## 2015-02-11 LAB — HEPATIC FUNCTION PANEL
ALK PHOS: 95 IU/L (ref 39–117)
ALT: 38 IU/L — AB (ref 0–32)
AST: 38 IU/L (ref 0–40)
Albumin: 4.5 g/dL (ref 3.5–4.8)
BILIRUBIN, DIRECT: 0.25 mg/dL (ref 0.00–0.40)
Bilirubin Total: 0.9 mg/dL (ref 0.0–1.2)
Total Protein: 7.1 g/dL (ref 6.0–8.5)

## 2015-02-14 DIAGNOSIS — Z1231 Encounter for screening mammogram for malignant neoplasm of breast: Secondary | ICD-10-CM | POA: Diagnosis not present

## 2015-02-21 ENCOUNTER — Ambulatory Visit (INDEPENDENT_AMBULATORY_CARE_PROVIDER_SITE_OTHER): Payer: Medicare Other | Admitting: Family Medicine

## 2015-02-21 ENCOUNTER — Encounter: Payer: Self-pay | Admitting: Family Medicine

## 2015-02-21 VITALS — BP 100/70 | Ht 62.0 in | Wt 122.4 lb

## 2015-02-21 DIAGNOSIS — R739 Hyperglycemia, unspecified: Secondary | ICD-10-CM

## 2015-02-21 DIAGNOSIS — Z0000 Encounter for general adult medical examination without abnormal findings: Secondary | ICD-10-CM

## 2015-02-21 DIAGNOSIS — R7301 Impaired fasting glucose: Secondary | ICD-10-CM

## 2015-02-21 DIAGNOSIS — R7309 Other abnormal glucose: Secondary | ICD-10-CM

## 2015-02-21 LAB — POCT GLYCOSYLATED HEMOGLOBIN (HGB A1C): Hemoglobin A1C: 5.9

## 2015-02-21 NOTE — Patient Instructions (Addendum)
If you desire you can check sugar once a month or so in the mor ing with your numbers goal less than ideally 100 and for sure less than 130  Prediabetes Eating Plan Prediabetes--also called impaired glucose tolerance or impaired fasting glucose--is a condition that causes blood sugar (blood glucose) levels to be higher than normal. Following a healthy diet can help to keep prediabetes under control. It can also help to lower the risk of type 2 diabetes and heart disease, which are increased in people who have prediabetes. Along with regular exercise, a healthy diet:  Promotes weight loss.  Helps to control blood sugar levels.  Helps to improve the way that the body uses insulin. WHAT DO I NEED TO KNOW ABOUT THIS EATING PLAN?  Use the glycemic index (GI) to plan your meals. The index tells you how quickly a food will raise your blood sugar. Choose low-GI foods. These foods take a longer time to raise blood sugar.  Pay close attention to the amount of carbohydrates in the food that you eat. Carbohydrates increase blood sugar levels.  Keep track of how many calories you take in. Eating the right amount of calories will help you to achieve a healthy weight. Losing about 7 percent of your starting weight can help to prevent type 2 diabetes.  You may want to follow a Mediterranean diet. This diet includes a lot of vegetables, lean meats or fish, whole grains, fruits, and healthy oils and fats. WHAT FOODS CAN I EAT? Grains Whole grains, such as whole-wheat or whole-grain breads, crackers, cereals, and pasta. Unsweetened oatmeal. Bulgur. Barley. Quinoa. Brown rice. Corn or whole-wheat flour tortillas or taco shells. Vegetables Lettuce. Spinach. Peas. Beets. Cauliflower. Cabbage. Broccoli. Carrots. Tomatoes. Squash. Eggplant. Herbs. Peppers. Onions. Cucumbers. Brussels sprouts. Fruits Berries. Bananas. Apples. Oranges. Grapes. Papaya. Mango. Pomegranate. Kiwi. Grapefruit. Cherries. Meats and Other  Protein Sources Seafood. Lean meats, such as chicken and [place] or lean cuts of pork and beef. Tofu. Eggs. Nuts. Beans. Dairy Low-fat or fat-free dairy products, such as yogurt, cottage cheese, and cheese. Beverages Water. Tea. Coffee. Sugar-free or diet soda. Seltzer water. Milk. Milk alternatives, such as soy or almond milk. Condiments Mustard. Relish. Low-fat, low-sugar ketchup. Low-fat, low-sugar barbecue sauce. Low-fat or fat-free mayonnaise. Sweets and Desserts Sugar-free or low-fat pudding. Sugar-free or low-fat ice cream and other frozen treats. Fats and Oils Avocado. Walnuts. Olive oil. The items listed above may not be a complete list of recommended foods or beverages. Contact your dietitian for more options.  WHAT FOODS ARE NOT RECOMMENDED? Grains Refined white flour and flour products, such as bread, pasta, snack foods, and cereals. Beverages Sweetened drinks, such as sweet iced tea and soda. Sweets and Desserts Baked goods, such as cake, cupcakes, pastries, cookies, and cheesecake. The items listed above may not be a complete list of foods and beverages to avoid. Contact your dietitian for more information.   This information is not intended to replace advice given to you by your health care provider. Make sure you discuss any questions you have with your health care provider.   Document Released: 05/31/2014 Document Reviewed: 05/31/2014 Elsevier Interactive Patient Education [employer]

## 2015-02-21 NOTE — Progress Notes (Signed)
Subjective:    Patient ID: Katherine Hoffman, female    DOB: 1940-02-07, 75 y.o.   MRN: [id number]  HPI AWV- Annual Wellness Visit  The patient was seen for their annual wellness visit. The patient's past medical history, surgical history, and family history were reviewed. Pertinent vaccines were reviewed ( tetanus, pneumonia, shingles, flu) The patient's medication list was reviewed and updated.  The height and weight were entered. The patient's current BMI is:22.38  Cognitive screening was completed. Outcome of Mini - Cog: passed  Falls within the past 6 months:none  Current tobacco usage: non-smoker (All patients who use tobacco were given written and verbal information on quitting)  Recent listing of emergency department/hospitalizations over the past year were reviewed.  current specialist the patient sees on a regular basis: none   Medicare annual wellness visit patient questionnaire was reviewed.  A written screening schedule for the patient for the next 5-10 years was given. Appropriate discussion of followup regarding next visit was discussed.  Patient has no concerns at this time.   Patient does not get pap smear or breast exam here.    Last colonoscopy in 2015  Results for orders placed or performed in visit on 02/09/15  Lipid panel  Result Value Ref Range   Cholesterol, Total 214 (H) 100 - 199 mg/dL   Triglycerides 409 (H) 0 - 149 mg/dL   HDL 37 (L) >81 mg/dL   VLDL Cholesterol Cal 66 (H) 5 - 40 mg/dL   LDL Calculated 191 (H) 0 - 99 mg/dL   Chol/HDL Ratio 5.8 (H) 0.0 - 4.4 ratio units  Hepatic function panel  Result Value Ref Range   Total Protein 7.1 6.0 - 8.5 g/dL   Albumin 4.5 3.5 - 4.8 g/dL   Bilirubin Total 0.9 0.0 - 1.2 mg/dL   Bilirubin, Direct 4.78 0.00 - 0.40 mg/dL   Alkaline Phosphatase 95 39 - 117 IU/L   AST 38 0 - 40 IU/L   ALT 38 (H) 0 - 32 IU/L  Basic metabolic panel  Result Value Ref Range   Glucose 165 (H) 65 - 99 mg/dL   BUN 7 (L)  8 - 27 mg/dL   Creatinine, Ser 2.95 0.57 - 1.00 mg/dL   GFR calc non Af Amer 71 >59 mL/min/1.73   GFR calc Af Amer 82 >59 mL/min/1.73   BUN/Creatinine Ratio 9 (L) 11 - 26   Sodium 142 134 - 144 mmol/L   Potassium 4.6 3.5 - 5.2 mmol/L   Chloride 97 96 - 106 mmol/L   CO2 24 18 - 29 mmol/L   Calcium 10.3 8.7 - 10.3 mg/dL   Exercising several days per wk,  cannot take flu shot, scared to take pneumonia shot and zoste wants no vaccinres    Patient states blood work was obtained [employer]. Very strong family history diabetes. Reports diet overall is good Review of Systems  Constitutional: Negative for activity change, appetite change and fatigue.  HENT: Negative for congestion, ear discharge and rhinorrhea.   Eyes: Negative for discharge.  Respiratory: Negative for cough, chest tightness and wheezing.   Cardiovascular: Negative for chest pain.  Gastrointestinal: Negative for vomiting and abdominal pain.  Genitourinary: Negative for frequency and difficulty urinating.  Musculoskeletal: Negative for neck pain.  Allergic/Immunologic: Negative for environmental allergies and food allergies.  Neurological: Negative for weakness and headaches.  Psychiatric/Behavioral: Negative for behavioral problems and agitation.  All other systems reviewed and are negative.      Objective:  Physical Exam  Constitutional: She is oriented to person, place, and time. She appears well-developed and well-nourished.  HENT:  Head: Normocephalic.  Right Ear: External ear normal.  Left Ear: External ear normal.  Eyes: Pupils are equal, round, and reactive to light.  Neck: Normal range of motion. No thyromegaly present.  Cardiovascular: Normal rate, regular rhythm, normal heart sounds and intact distal pulses.   No murmur heard. Pulmonary/Chest: Effort normal and breath sounds normal. No respiratory distress. She has no wheezes.  Abdominal: Soft. Bowel sounds are normal. She exhibits no distension and  no mass. There is no tenderness.  Musculoskeletal: Normal range of motion. She exhibits no edema or tenderness.  Lymphadenopathy:    She has no cervical adenopathy.  Neurological: She is alert and oriented to person, place, and time. She exhibits normal muscle tone.  Skin: Skin is warm and dry.  Psychiatric: She has a normal mood and affect. Her behavior is normal.  Vitals reviewed.         Assessment & Plan:  Impression well adult exam #2 impaired fasting glucose discussed at length. A1c today thankfully in the fives. Diet exercise and sugar control discussed long-term nature of this condition discussed plan special diet discussed patient declines all vaccines. Follow-up as needed

## 2015-02-28 ENCOUNTER — Encounter: Payer: Self-pay | Admitting: Family Medicine

## 2015-03-03 ENCOUNTER — Other Ambulatory Visit: Payer: Self-pay | Admitting: Family Medicine

## 2015-03-03 NOTE — Telephone Encounter (Signed)
Ok for six ref

## 2015-03-10 DIAGNOSIS — H04122 Dry eye syndrome of left lacrimal gland: Secondary | ICD-10-CM | POA: Diagnosis not present

## 2015-03-10 DIAGNOSIS — H04121 Dry eye syndrome of right lacrimal gland: Secondary | ICD-10-CM | POA: Diagnosis not present

## 2015-04-21 DIAGNOSIS — H04121 Dry eye syndrome of right lacrimal gland: Secondary | ICD-10-CM | POA: Diagnosis not present

## 2015-04-25 DIAGNOSIS — M5441 Lumbago with sciatica, right side: Secondary | ICD-10-CM | POA: Diagnosis not present

## 2015-04-25 DIAGNOSIS — M25551 Pain in right hip: Secondary | ICD-10-CM | POA: Diagnosis not present

## 2015-05-05 DIAGNOSIS — M25551 Pain in right hip: Secondary | ICD-10-CM | POA: Diagnosis not present

## 2015-05-15 DIAGNOSIS — M5441 Lumbago with sciatica, right side: Secondary | ICD-10-CM | POA: Diagnosis not present

## 2015-05-15 DIAGNOSIS — M25551 Pain in right hip: Secondary | ICD-10-CM | POA: Diagnosis not present

## 2015-05-15 DIAGNOSIS — M66861 Spontaneous rupture of other tendons, right lower leg: Secondary | ICD-10-CM | POA: Diagnosis not present

## 2015-06-02 DIAGNOSIS — M7542 Impingement syndrome of left shoulder: Secondary | ICD-10-CM | POA: Diagnosis not present

## 2015-06-15 DIAGNOSIS — M7541 Impingement syndrome of right shoulder: Secondary | ICD-10-CM | POA: Diagnosis not present

## 2015-06-23 DIAGNOSIS — M7542 Impingement syndrome of left shoulder: Secondary | ICD-10-CM | POA: Diagnosis not present

## 2015-06-23 DIAGNOSIS — M75102 Unspecified rotator cuff tear or rupture of left shoulder, not specified as traumatic: Secondary | ICD-10-CM | POA: Diagnosis not present

## 2015-06-23 DIAGNOSIS — M19012 Primary osteoarthritis, left shoulder: Secondary | ICD-10-CM | POA: Diagnosis not present

## 2015-06-28 ENCOUNTER — Encounter (HOSPITAL_COMMUNITY): Payer: Self-pay

## 2015-06-28 ENCOUNTER — Ambulatory Visit (HOSPITAL_COMMUNITY): Payer: Medicare Other | Attending: Specialist

## 2015-06-28 DIAGNOSIS — R29898 Other symptoms and signs involving the musculoskeletal system: Secondary | ICD-10-CM | POA: Diagnosis not present

## 2015-06-28 NOTE — Patient Instructions (Signed)
Doorway Stretch  Place each hand opposite each other on the doorway. (You can change where you feel the stretch by moving arms higher or lower.) Step through with one foot and bend front knee until a stretch is felt and hold. Step through with the opposite foot on the next rep. Hold for __10___ seconds. Repeat _2___times.          Wall Flexion  Using a towel, slide your arm up the wall until a stretch is felt in your shoulder. Hold for 10 seconds Complete 2 times.     Shoulder Abduction Stretch  Stand side ways by a wall with affected up on wall. Gently lean toward wall to feel stretch. Hold for 10 seconds. Complete 2 times.     ELASTIC BAND BILATERAL EXTERNAL ROTATION - ER  While holding an elastic band with your elbows bent, pull your hands away from your stomach area. Keep  your elbows near the side of your body.  Complete 10-12 times.
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
## 2015-07-11 DIAGNOSIS — M19012 Primary osteoarthritis, left shoulder: Secondary | ICD-10-CM | POA: Diagnosis not present

## 2015-08-02 ENCOUNTER — Other Ambulatory Visit: Payer: Self-pay | Admitting: Family Medicine

## 2015-08-02 NOTE — Telephone Encounter (Signed)
Six mo worth if ok

## 2015-08-07 DIAGNOSIS — M19012 Primary osteoarthritis, left shoulder: Secondary | ICD-10-CM | POA: Diagnosis not present

## 2015-08-07 DIAGNOSIS — M75102 Unspecified rotator cuff tear or rupture of left shoulder, not specified as traumatic: Secondary | ICD-10-CM | POA: Diagnosis not present

## 2015-08-07 DIAGNOSIS — M7542 Impingement syndrome of left shoulder: Secondary | ICD-10-CM | POA: Diagnosis not present

## 2015-08-08 ENCOUNTER — Other Ambulatory Visit: Payer: Self-pay | Admitting: Family Medicine

## 2015-11-28 DIAGNOSIS — L708 Other acne: Secondary | ICD-10-CM | POA: Diagnosis not present

## 2015-11-28 DIAGNOSIS — L218 Other seborrheic dermatitis: Secondary | ICD-10-CM | POA: Diagnosis not present

## 2015-12-29 ENCOUNTER — Other Ambulatory Visit: Payer: Self-pay | Admitting: Family Medicine

## 2016-02-05 ENCOUNTER — Other Ambulatory Visit: Payer: Self-pay | Admitting: Family Medicine

## 2016-02-23 ENCOUNTER — Encounter: Payer: Self-pay | Admitting: Family Medicine

## 2016-02-23 DIAGNOSIS — Z1231 Encounter for screening mammogram for malignant neoplasm of breast: Secondary | ICD-10-CM | POA: Diagnosis not present

## 2016-02-27 ENCOUNTER — Other Ambulatory Visit: Payer: Self-pay | Admitting: Family Medicine

## 2016-02-27 ENCOUNTER — Telehealth: Payer: Self-pay | Admitting: Family Medicine

## 2016-02-27 DIAGNOSIS — Z13220 Encounter for screening for lipoid disorders: Secondary | ICD-10-CM

## 2016-02-27 DIAGNOSIS — Z79899 Other long term (current) drug therapy: Secondary | ICD-10-CM

## 2016-02-27 NOTE — Telephone Encounter (Signed)
Pt is requesting lab orders to be sent over for an upcoming appt. Last labs per epic were: bmp,hepatic,and lipid on 02/10/15

## 2016-02-27 NOTE — Telephone Encounter (Signed)
Review screening mammogram results in results folder. °

## 2016-02-28 NOTE — Telephone Encounter (Signed)
Patient notified blood work ordered.

## 2016-02-28 NOTE — Telephone Encounter (Signed)
Repeat same 

## 2016-03-04 ENCOUNTER — Other Ambulatory Visit: Payer: Self-pay | Admitting: Family Medicine

## 2016-03-04 DIAGNOSIS — Z13220 Encounter for screening for lipoid disorders: Secondary | ICD-10-CM | POA: Diagnosis not present

## 2016-03-04 DIAGNOSIS — Z79899 Other long term (current) drug therapy: Secondary | ICD-10-CM | POA: Diagnosis not present

## 2016-03-05 LAB — BASIC METABOLIC PANEL
BUN / CREAT RATIO: 14 (ref 12–28)
BUN: 12 mg/dL (ref 8–27)
CHLORIDE: 99 mmol/L (ref 96–106)
CO2: 27 mmol/L (ref 18–29)
Calcium: 10.3 mg/dL (ref 8.7–10.3)
Creatinine, Ser: 0.84 mg/dL (ref 0.57–1.00)
GFR calc Af Amer: 79 mL/min/{1.73_m2} (ref >59)
GFR calc non Af Amer: 68 mL/min/{1.73_m2} (ref >59)
GLUCOSE: 143 mg/dL — AB (ref 65–99)
Potassium: 4.4 mmol/L (ref 3.5–5.2)
SODIUM: 143 mmol/L (ref 134–144)

## 2016-03-05 LAB — LIPID PANEL
Chol/HDL Ratio: 5.9 ratio units — ABNORMAL HIGH (ref 0.0–4.4)
Cholesterol, Total: 220 mg/dL — ABNORMAL HIGH (ref 100–199)
HDL: 37 mg/dL — AB (ref >39)
LDL Calculated: 123 mg/dL — ABNORMAL HIGH (ref 0–99)
TRIGLYCERIDES: 300 mg/dL — AB (ref 0–149)
VLDL Cholesterol Cal: 60 mg/dL — ABNORMAL HIGH (ref 5–40)

## 2016-03-05 LAB — HEPATIC FUNCTION PANEL
ALT: 25 IU/L (ref 0–32)
AST: 23 IU/L (ref 0–40)
Albumin: 4.6 g/dL (ref 3.5–4.8)
Alkaline Phosphatase: 100 IU/L (ref 39–117)
BILIRUBIN, DIRECT: 0.17 mg/dL (ref 0.00–0.40)
Bilirubin Total: 0.7 mg/dL (ref 0.0–1.2)
Total Protein: 7.3 g/dL (ref 6.0–8.5)

## 2016-03-07 ENCOUNTER — Telehealth: Payer: Self-pay | Admitting: Family Medicine

## 2016-03-07 NOTE — Telephone Encounter (Signed)
Patient says that she completed her lab work and mammogram as requested.  She has a physical scheduled for 03/20/16 with Dr. Brett CanalesSteve.  She says she really doesn't want to come in the office right now with the flu going around so bad.  She wants to know if she can come sometime next month instead?

## 2016-03-07 NOTE — Telephone Encounter (Signed)
Appointment cancelled, rescheduled for March with patient.

## 2016-03-07 NOTE — Telephone Encounter (Signed)
sure

## 2016-03-20 ENCOUNTER — Encounter: Payer: Medicare Other | Admitting: Family Medicine

## 2016-04-05 ENCOUNTER — Other Ambulatory Visit: Payer: Self-pay | Admitting: Family Medicine

## 2016-04-25 ENCOUNTER — Encounter: Payer: Medicare Other | Admitting: Family Medicine

## 2016-04-26 ENCOUNTER — Other Ambulatory Visit: Payer: Self-pay | Admitting: Nurse Practitioner

## 2016-05-02 ENCOUNTER — Ambulatory Visit (INDEPENDENT_AMBULATORY_CARE_PROVIDER_SITE_OTHER): Payer: Medicare Other | Admitting: Family Medicine

## 2016-05-02 ENCOUNTER — Encounter: Payer: Self-pay | Admitting: Family Medicine

## 2016-05-02 VITALS — BP 158/90 | Ht 62.0 in | Wt 123.5 lb

## 2016-05-02 DIAGNOSIS — F5101 Primary insomnia: Secondary | ICD-10-CM

## 2016-05-02 DIAGNOSIS — R7301 Impaired fasting glucose: Secondary | ICD-10-CM

## 2016-05-02 DIAGNOSIS — K219 Gastro-esophageal reflux disease without esophagitis: Secondary | ICD-10-CM | POA: Diagnosis not present

## 2016-05-02 DIAGNOSIS — Z0000 Encounter for general adult medical examination without abnormal findings: Secondary | ICD-10-CM

## 2016-05-02 LAB — POCT GLYCOSYLATED HEMOGLOBIN (HGB A1C): Hemoglobin A1C: 6.9

## 2016-05-02 MED ORDER — KETOCONAZOLE 2 % EX CREA: 1.0000 "application " | TOPICAL_CREAM | Freq: Two times a day (BID) | CUTANEOUS | 4 refills | Status: DC

## 2016-05-02 MED ORDER — OMEPRAZOLE 40 MG PO CPDR: 40.0000 mg | DELAYED_RELEASE_CAPSULE | Freq: Every day | ORAL | 11 refills | Status: DC

## 2016-05-02 NOTE — Progress Notes (Signed)
Subjective:    Patient ID: Katherine Hoffman, female    DOB: 1940/06/12, 76 y.o.   MRN: [id number]  HPI AWV- Annual Wellness Visit  The patient was seen for their annual wellness visit. The patient's past medical history, surgical history, and family history were reviewed. Pertinent vaccines were reviewed ( tetanus, pneumonia, shingles, flu) The patient's medication list was reviewed and updated.  The height and weight were entered. The patient's current BMI is:  Cognitive screening was completed. Outcome of Mini - Cog: Pass  Falls within the past 6 months: None   Current tobacco usage: None  (All patients who use tobacco were given written and verbal information on quitting)  Recent listing of emergency department/hospitalizations over the past year were reviewed.  current specialist the patient sees on a regular basis: Patient states does not see any specialist.  Mammogram done was negative in jan   Last colonoscopy done in 2015. None due for ten yrs since after then   Medicare annual wellness visit patient questionnaire was reviewed.  A written screening schedule for the patient for the next 5-10 years was given. Appropriate discussion of followup regarding next visit was discussed.  Results for orders placed or performed in visit on 02/27/16  Lipid panel  Result Value Ref Range   Cholesterol, Total 220 (H) 100 - 199 mg/dL   Triglycerides 409 (H) 0 - 149 mg/dL   HDL 37 (L) >81 mg/dL   VLDL Cholesterol Cal 60 (H) 5 - 40 mg/dL   LDL Calculated 191 (H) 0 - 99 mg/dL   Chol/HDL Ratio 5.9 (H) 0.0 - 4.4 ratio units  Hepatic function panel  Result Value Ref Range   Total Protein 7.3 6.0 - 8.5 g/dL   Albumin 4.6 3.5 - 4.8 g/dL   Bilirubin Total 0.7 0.0 - 1.2 mg/dL   Bilirubin, Direct 4.78 0.00 - 0.40 mg/dL   Alkaline Phosphatase 100 39 - 117 IU/L   AST 23 0 - 40 IU/L   ALT 25 0 - 32 IU/L  Basic metabolic panel  Result Value Ref Range   Glucose 143 (H) 65 - 99 mg/dL   BUN 12 8 - 27 mg/dL   Creatinine, Ser 2.95 0.57 - 1.00 mg/dL   GFR calc non Af Amer 68 >59 mL/min/1.73   GFR calc Af Amer 79 >59 mL/min/1.73   BUN/Creatinine Ratio 14 12 - 28   Sodium 143 134 - 144 mmol/L   Potassium 4.4 3.5 - 5.2 mmol/L   Chloride 99 96 - 106 mmol/L   CO2 27 18 - 29 mmol/L   Calcium 10.3 8.7 - 10.3 mg/dL    Results for orders placed or performed in visit on 05/02/16  POCT HgB A1C  Result Value Ref Range   Hemoglobin A1C 6.9     Reflux table Notes ongoing insomnia. Definitely needs to press. No obvious side effects in the morning. Without it does not get enough sleep.  Patient has concerns of itching/ irritated place to scalp.  Also has concerns of drainage in ears behind hearing aids.   Declines pneumococcal vaccines Review of Systems  Constitutional: Negative.  Negative for activity change, appetite change and fatigue.  HENT: Negative for congestion, ear discharge and rhinorrhea.   Eyes: Negative for discharge.  Respiratory: Negative for cough, chest tightness and wheezing.   Cardiovascular: Negative for chest pain.  Gastrointestinal: Negative for abdominal pain and vomiting.  Genitourinary: Negative for difficulty urinating and frequency.  Musculoskeletal: Negative for neck pain.  Allergic/Immunologic: Negative for environmental allergies and food allergies.  Neurological: Negative for weakness and headaches.  Psychiatric/Behavioral: Negative for agitation and behavioral problems.  All other systems reviewed and are negative.      Objective:   Physical Exam  Constitutional: She is oriented to person, place, and time. She appears well-developed and well-nourished.  HENT:  Head: Normocephalic.  Right Ear: External ear normal.  Left Ear: External ear normal.  Eyes: Pupils are equal, round, and reactive to light.  Neck: Normal range of motion. No thyromegaly present.  Cardiovascular: Normal rate, regular rhythm, normal heart sounds and intact distal  pulses.   No murmur heard. Pulmonary/Chest: Effort normal and breath sounds normal. No respiratory distress. She has no wheezes.  Abdominal: Soft. Bowel sounds are normal. She exhibits no distension and no mass. There is no tenderness.  Musculoskeletal: Normal range of motion. She exhibits no edema or tenderness.  Lymphadenopathy:    She has no cervical adenopathy.  Neurological: She is alert and oriented to person, place, and time. She exhibits normal muscle tone.  Skin: Skin is warm and dry.  Psychiatric: She has a normal mood and affect. Her behavior is normal.   scalp reveals patchy inflammatory changes      Assessment & Plan:  Impression 1 wellness exam. Declines all vaccines. Diet exercise discussed. Up-to-date on mammogram. Up-to-date on colonoscopy #2 insomnia ongoing need for meds discussed 3 scalp rash nonspecific pruritic we'll try ketoconazole No. 4 blood work reviewed elevated sugar A1c +6.9 to return in couple weeks for diabetes Second visit

## 2016-05-02 NOTE — Patient Instructions (Signed)
Prediabetes Prediabetes is the condition of having a blood sugar (blood glucose) level that is higher than it should be, but not high enough for you to be diagnosed with type 2 diabetes. Having prediabetes puts you at risk for developing type 2 diabetes (type 2 diabetes mellitus). Prediabetes may be called impaired glucose tolerance or impaired fasting glucose. Prediabetes usually does not cause symptoms. Your health care provider can diagnose this condition with blood tests. You may be tested for prediabetes if you are overweight and if you have at least one other risk factor for prediabetes. Risk factors for prediabetes include:  Having a family member with type 2 diabetes.  Being overweight or obese.  Being older than age 45.  Being of American-Indian, African-American, Hispanic/Latino, or Asian/Pacific Islander descent.  Having an inactive (sedentary) lifestyle.  Having a history of gestational diabetes or polycystic ovarian syndrome (PCOS).  Having low levels of good cholesterol (HDL-C) or high levels of blood fats (triglycerides).  Having high blood pressure. What is blood glucose and how is blood glucose measured?   Blood glucose refers to the amount of glucose in your bloodstream. Glucose comes from eating foods that contain sugars and starches (carbohydrates) that the body breaks down into glucose. Your blood glucose level may be measured in mg/dL (milligrams per deciliter) or mmol/L (millimoles per liter).Your blood glucose may be checked with one or more of the following blood tests:  A fasting blood glucose (FBG) test. You will not be allowed to eat (you will fast) for at least 8 hours before a blood sample is taken.  A normal range for FBG is 70-100 mg/dl (3.9-5.6 mmol/L).  An A1c (hemoglobin A1c) blood test. This test provides information about blood glucose control over the previous 2?3months.  An oral glucose tolerance test (OGTT). This test measures your blood glucose  twice:  After fasting. This is your baseline level.  Two hours after you drink a beverage that contains glucose. You may be diagnosed with prediabetes:  If your FBG is 100?125 mg/dL (5.6-6.9 mmol/L).  If your A1c level is 5.7?6.4%.  If your OGGT result is 140?199 mg/dL (7.8-11 mmol/L). These blood tests may be repeated to confirm your diagnosis. What happens if blood glucose is too high? The pancreas produces a hormone (insulin) that helps move glucose from the bloodstream into cells. When cells in the body do not respond properly to insulin that the body makes (insulin resistance), excess glucose builds up in the blood instead of going into cells. As a result, high blood glucose (hyperglycemia) can develop, which can cause many complications. This is a symptom of prediabetes. What can happen if blood glucose stays higher than normal for a long time? Having high blood glucose for a long time is dangerous. Too much glucose in your blood can damage your nerves and blood vessels. Long-term damage can lead to complications from diabetes, which may include:  Heart disease.  Stroke.  Blindness.  Kidney disease.  Depression.  Poor circulation in the feet and legs, which could lead to surgical removal (amputation) in severe cases. How can prediabetes be prevented from turning into type 2 diabetes?   To help prevent type 2 diabetes, take the following actions:  Be physically active.  Do moderate-intensity physical activity for at least 30 minutes on at least 5 days of the week, or as much as told by your health care provider. This could be brisk walking, biking, or water aerobics.  Ask your health care provider what activities are   safe for you. A mix of physical activities may be best, such as walking, swimming, cycling, and strength training.  Lose weight as told by your health care provider.  Losing 5-7% of your body weight can reverse insulin resistance.  Your health care  provider can determine how much weight loss is best for you and can help you lose weight safely.  Follow a healthy meal plan. This includes eating lean proteins, complex carbohydrates, fresh fruits and vegetables, low-fat dairy products, and healthy fats.  Follow instructions from your health care provider about eating or drinking restrictions.  Make an appointment to see a diet and nutrition specialist (registered dietitian) to help you create a healthy eating plan that is right for you.  Do not smoke or use any tobacco products, such as cigarettes, chewing tobacco, and e-cigarettes. If you need help quitting, ask your health care provider.  Take over-the-counter and prescription medicines as told by your health care provider. You may be prescribed medicines that help lower the risk of type 2 diabetes. This information is not intended to replace advice given to you by your health care provider. Make sure you discuss any questions you have with your health care provider. Document Released: 05/08/2015 Document Revised: 06/22/2015 Document Reviewed: 03/07/2015 Elsevier Interactive Patient Education  2017 [employer] Cholesterol Cholesterol is a white, waxy, fat-like substance that is needed by the human body in small amounts. The liver makes all the cholesterol we need. Cholesterol is carried from the liver by the blood through the blood vessels. Deposits of cholesterol (plaques) may build up on blood vessel (artery) walls. Plaques make the arteries narrower and stiffer. Cholesterol plaques increase the risk for heart attack and stroke. You cannot feel your cholesterol level even if it is very high. The only way to know that it is high is to have a blood test. Once you know your cholesterol levels, you should keep a record of the test results. Work with your health care provider to keep your levels in the desired range. What do the results mean?  Total cholesterol is a rough measure of all the  cholesterol in your blood.  LDL (low-density lipoprotein) is the "bad" cholesterol. This is the type that causes plaque to build up on the artery walls. You want this level to be low.  HDL (high-density lipoprotein) is the "good" cholesterol because it cleans the arteries and carries the LDL away. You want this level to be high.  Triglycerides are fat that the body can either burn for energy or store. High levels are closely linked to heart disease. What are the desired levels of cholesterol?  Total cholesterol below 200.  LDL below 100 for people who are at risk, below 70 for people at very high risk.  HDL above 40 is good. A level of 60 or higher is considered to be protective against heart disease.  Triglycerides below 150. How can I lower my cholesterol? Diet  Follow your diet program as told by your health care provider.  Choose fish or white meat chicken and [place], roasted or baked. Limit fatty cuts of red meat, fried foods, and processed meats, such as sausage and lunch meats.  Eat lots of fresh fruits and vegetables.  Choose whole grains, beans, pasta, potatoes, and cereals.  Choose olive oil, corn oil, or canola oil, and use only small amounts.  Avoid butter, mayonnaise, shortening, or palm kernel oils.  Avoid foods with trans fats.  Drink skim or nonfat milk and eat  low-fat or nonfat yogurt and cheeses. Avoid whole milk, cream, ice cream, egg yolks, and full-fat cheeses.  Healthier desserts include angel food cake, ginger snaps, animal crackers, hard candy, popsicles, and low-fat or nonfat frozen yogurt. Avoid pastries, cakes, pies, and cookies. Exercise  Follow your exercise program as told by your health care provider. A regular program:  Helps to decrease LDL and raise HDL.  Helps with weight control.  Do things that increase your activity level, such as gardening, walking, and taking the stairs.  Ask your health care provider about ways that you can be more  active in your daily life. Medicine  Take over-the-counter and prescription medicines only as told by your health care provider.  Medicine may be prescribed by your health care provider to help lower cholesterol and decrease the risk for heart disease. This is usually done if diet and exercise have failed to bring down cholesterol levels.  If you have several risk factors, you may need medicine even if your levels are normal. This information is not intended to replace advice given to you by your health care provider. Make sure you discuss any questions you have with your health care provider. Document Released: 10/09/2000 Document Revised: 08/12/2015 Document Reviewed: 07/15/2015 Elsevier Interactive Patient Education  2017 [employer].

## 2016-05-17 ENCOUNTER — Ambulatory Visit (INDEPENDENT_AMBULATORY_CARE_PROVIDER_SITE_OTHER): Payer: Medicare Other | Admitting: Family Medicine

## 2016-05-17 ENCOUNTER — Encounter: Payer: Self-pay | Admitting: Family Medicine

## 2016-05-17 DIAGNOSIS — E119 Type 2 diabetes mellitus without complications: Secondary | ICD-10-CM | POA: Diagnosis not present

## 2016-05-17 NOTE — Patient Instructions (Signed)
Diabetes Mellitus and Food It is important for you to manage your blood sugar (glucose) level. Your blood glucose level can be greatly affected by what you eat. Eating healthier foods in the appropriate amounts throughout the day at about the same time each day will help you control your blood glucose level. It can also help slow or prevent worsening of your diabetes mellitus. Healthy eating may even help you improve the level of your blood pressure and reach or maintain a healthy weight. General recommendations for healthful eating and cooking habits include:  Eating meals and snacks regularly. Avoid going long periods of time without eating to lose weight.  Eating a diet that consists mainly of plant-based foods, such as fruits, vegetables, nuts, legumes, and whole grains.  Using low-heat cooking methods, such as baking, instead of high-heat cooking methods, such as deep frying.  Work with your dietitian to make sure you understand how to use the Nutrition Facts information on food labels. How can food affect me? Carbohydrates Carbohydrates affect your blood glucose level more than any other type of food. Your dietitian will help you determine how many carbohydrates to eat at each meal and teach you how to count carbohydrates. Counting carbohydrates is important to keep your blood glucose at a healthy level, especially if you are using insulin or taking certain medicines for diabetes mellitus. Alcohol Alcohol can cause sudden decreases in blood glucose (hypoglycemia), especially if you use insulin or take certain medicines for diabetes mellitus. Hypoglycemia can be a life-threatening condition. Symptoms of hypoglycemia (sleepiness, dizziness, and disorientation) are similar to symptoms of having too much alcohol. If your health care provider has given you approval to drink alcohol, do so in moderation and use the following guidelines:  Women should not have more than one drink per day, and men  should not have more than two drinks per day. One drink is equal to: ? 12 oz of beer. ? 5 oz of wine. ? 1 oz of hard liquor.  Do not drink on an empty stomach.  Keep yourself hydrated. Have water, diet soda, or unsweetened iced tea.  Regular soda, juice, and other mixers might contain a lot of carbohydrates and should be counted.  What foods are not recommended? As you make food choices, it is important to remember that all foods are not the same. Some foods have fewer nutrients per serving than other foods, even though they might have the same number of calories or carbohydrates. It is difficult to get your body what it needs when you eat foods with fewer nutrients. Examples of foods that you should avoid that are high in calories and carbohydrates but low in nutrients include:  Trans fats (most processed foods list trans fats on the Nutrition Facts label).  Regular soda.  Juice.  Candy.  Sweets, such as cake, pie, doughnuts, and cookies.  Fried foods.  What foods can I eat? Eat nutrient-rich foods, which will nourish your body and keep you healthy. The food you should eat also will depend on several factors, including:  The calories you need.  The medicines you take.  Your weight.  Your blood glucose level.  Your blood pressure level.  Your cholesterol level.  You should eat a variety of foods, including:  Protein. ? Lean cuts of meat. ? Proteins low in saturated fats, such as fish, egg whites, and beans. Avoid processed meats.  Fruits and vegetables. ? Fruits and vegetables that may help control blood glucose levels, such as apples,   mangoes, and yams.  Dairy products. ? Choose fat-free or low-fat dairy products, such as milk, yogurt, and cheese.  Grains, bread, pasta, and rice. ? Choose whole grain products, such as multigrain bread, whole oats, and brown rice. These foods may help control blood pressure.  Fats. ? Foods containing healthful fats, such as  nuts, avocado, olive oil, canola oil, and fish.  Does everyone with diabetes mellitus have the same meal plan? Because every person with diabetes mellitus is different, there is not one meal plan that works for everyone. It is very important that you meet with a dietitian who will help you create a meal plan that is just right for you. This information is not intended to replace advice given to you by your health care provider. Make sure you discuss any questions you have with your health care provider. Document Released: 10/11/2004 Document Revised: 06/22/2015 Document Reviewed: 12/11/2012 Elsevier Interactive Patient Education  2017 Elsevier Inc.  

## 2016-05-17 NOTE — Progress Notes (Signed)
   Subjective:    Patient ID: Katherine Hoffman, female    DOB: [birth year], 76 y.o.   MRN: [id number]  Diabetes  She presents for her initial diabetic visit. She has type 2 diabetes mellitus. Exercise: walks 1 - 3 miles a day, does yard work.  A1C 2 weeks ago. 6.9  Nine sibling all with diabetes  Watches diet closely  Eats a lot of veggies   starys activem, walking a lot, and regulaly and t mill for a mile pe5 day  Eye dr visit first of last  Pt needs reg eye dr visit   Results for orders placed or performed in visit on 05/02/16  POCT HgB A1C  Result Value Ref Range   Hemoglobin A1C 6.9     Pt declines vaccine, states flu shot gave flu like illness, husb felt bad,     Review of Systems No headache, no major weight loss or weight gain, no chest pain no back pain abdominal pain no change in bowel habits complete ROS otherwise negative     Objective:   Physical Exam  Alert and oriented, vitals reviewed and stable, NAD ENT-TM's and ext canals WNL bilat via otoscopic exam Soft palate, tonsils and post pharynx WNL via oropharyngeal exam Neck-symmetric, no masses; thyroid nonpalpable and nontender Pulmonary-no tachypnea or accessory muscle use; Clear without wheezes via auscultation Card--no abnrml murmurs, rhythm reg and rate WNL Carotid pulses symmetric, without bruits       Assessment & Plan:  Impression 1 type 2 diabetes. New diagnosis visit. Very long discussion held. At this time patient wishes to hold off on metformin. Glucometer prescribed. Diabetes education class strongly recommended. Many questions answered regarding short-term and long-term complications and implications for therapy, along with impact on other chronic conditions. Patient declines all vaccines at this time. Will schedule an eye Dr. visit. Follow-up in several months as scheduled.  Greater than 50% of this 25 minute face to face visit was spent in counseling and discussion and coordination of care  regarding the above diagnosis/diagnosies

## 2016-05-18 DIAGNOSIS — E119 Type 2 diabetes mellitus without complications: Secondary | ICD-10-CM | POA: Insufficient documentation

## 2016-05-27 ENCOUNTER — Telehealth: Payer: Self-pay | Admitting: Family Medicine

## 2016-05-27 NOTE — Telephone Encounter (Signed)
Discussed with pt. Pt verbalized understanding.  °

## 2016-05-27 NOTE — Telephone Encounter (Signed)
Patient was seen on 4/20 with pre-diabetes and couldn't remember how many times she was suppose to test her sugar. Was it once a week or twice a week until her follow up.

## 2016-05-27 NOTE — Telephone Encounter (Signed)
Once per week

## 2016-06-04 ENCOUNTER — Other Ambulatory Visit: Payer: Self-pay | Admitting: Nurse Practitioner

## 2016-07-17 ENCOUNTER — Telehealth: Payer: Self-pay | Admitting: Family Medicine

## 2016-07-17 NOTE — Telephone Encounter (Signed)
Patient wants to know if she needs to have blood work completed before her appointment on 08/16/16 with Dr. Brett CanalesSteve?

## 2016-07-17 NOTE — Telephone Encounter (Signed)
Patient had Lipid, Liver, Met 7 02/2016 and HgbA1c on 04/2016

## 2016-07-17 NOTE — Telephone Encounter (Signed)
No we'll do A1c then 

## 2016-07-17 NOTE — Telephone Encounter (Signed)
Pt.notified

## 2016-07-17 NOTE — Telephone Encounter (Signed)
Last labs 03/04/16 lipid, liver, bmp

## 2016-07-19 ENCOUNTER — Telehealth: Payer: Self-pay | Admitting: Family Medicine

## 2016-07-19 DIAGNOSIS — H6120 Impacted cerumen, unspecified ear: Secondary | ICD-10-CM

## 2016-07-19 NOTE — Telephone Encounter (Signed)
Patient went to get her hear aid tested  due to buzzing in her ears and they found that she had wax built up in them and needing them cleaned out.Needing a referral to get them done as soon as possible.

## 2016-07-19 NOTE — Telephone Encounter (Signed)
Let's do 

## 2016-07-19 NOTE — Telephone Encounter (Signed)
Referral ordered in EPIC. 

## 2016-07-25 ENCOUNTER — Other Ambulatory Visit: Payer: Self-pay | Admitting: Nurse Practitioner

## 2016-07-25 ENCOUNTER — Encounter: Payer: Self-pay | Admitting: Family Medicine

## 2016-07-25 NOTE — Telephone Encounter (Signed)
Last seen 05/17/2016

## 2016-07-25 NOTE — Telephone Encounter (Signed)
Ok six mo worth 

## 2016-08-02 DIAGNOSIS — H3500 Unspecified background retinopathy: Secondary | ICD-10-CM | POA: Diagnosis not present

## 2016-08-02 DIAGNOSIS — Z961 Presence of intraocular lens: Secondary | ICD-10-CM | POA: Diagnosis not present

## 2016-08-02 DIAGNOSIS — H04121 Dry eye syndrome of right lacrimal gland: Secondary | ICD-10-CM | POA: Diagnosis not present

## 2016-08-02 DIAGNOSIS — H5203 Hypermetropia, bilateral: Secondary | ICD-10-CM | POA: Diagnosis not present

## 2016-08-04 IMAGING — DX DG HIP (WITH OR WITHOUT PELVIS) 2-3V*R*
3 series · 3 of 3 positions shown · non-contrast
Comparison: None.

CLINICAL DATA: Pt states she was walking [REDACTED] when she
stumbled and began having pain in her Rt hip and numbness in her Rt
foot since.

EXAM:
DG HIP (WITH OR WITHOUT PELVIS) 2-3V RIGHT

[pelvis ap]
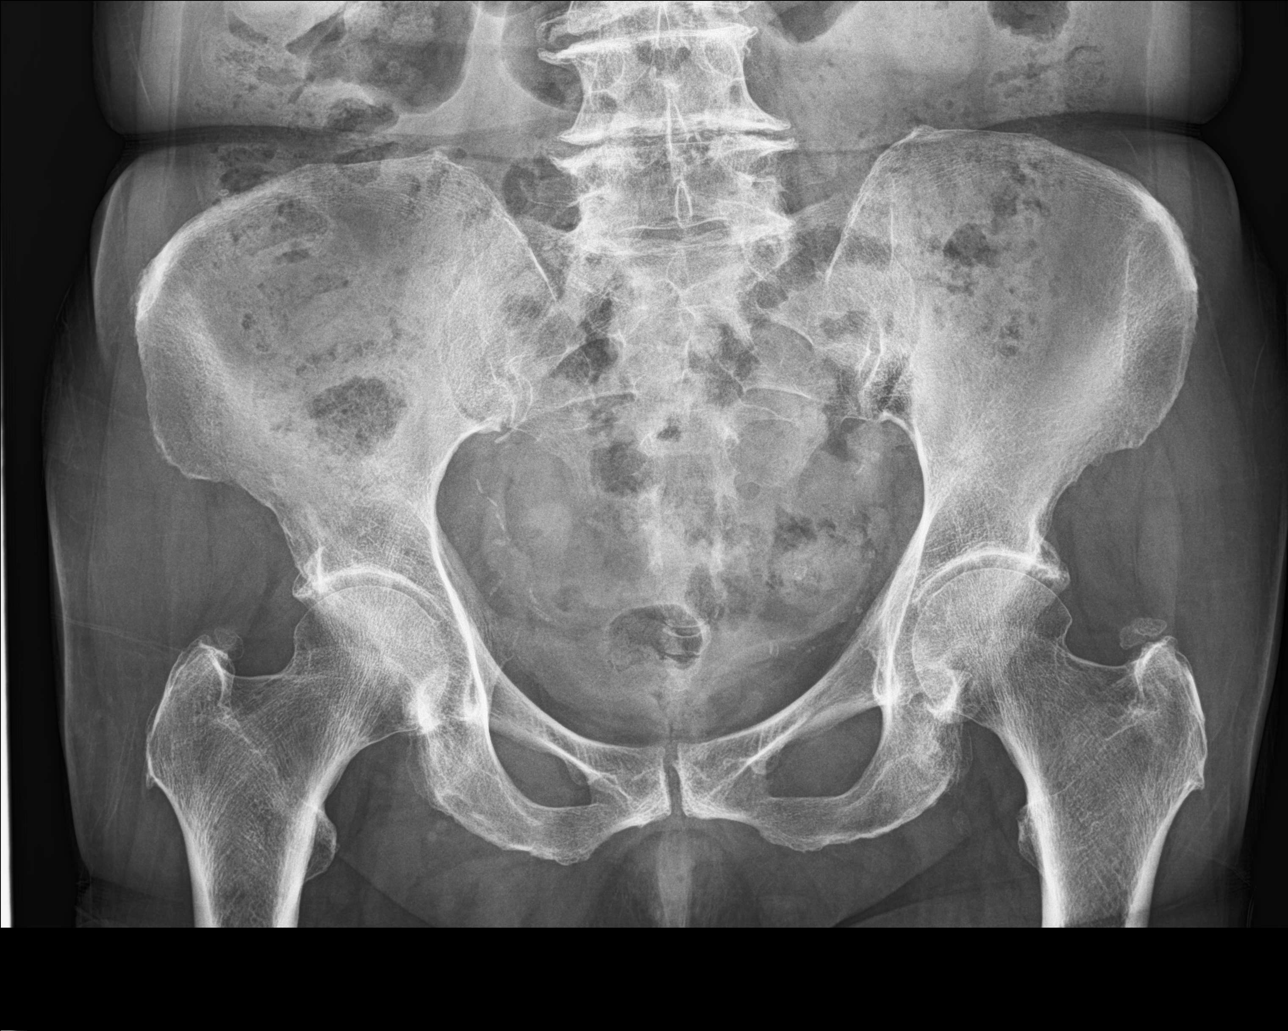

[hip ap]
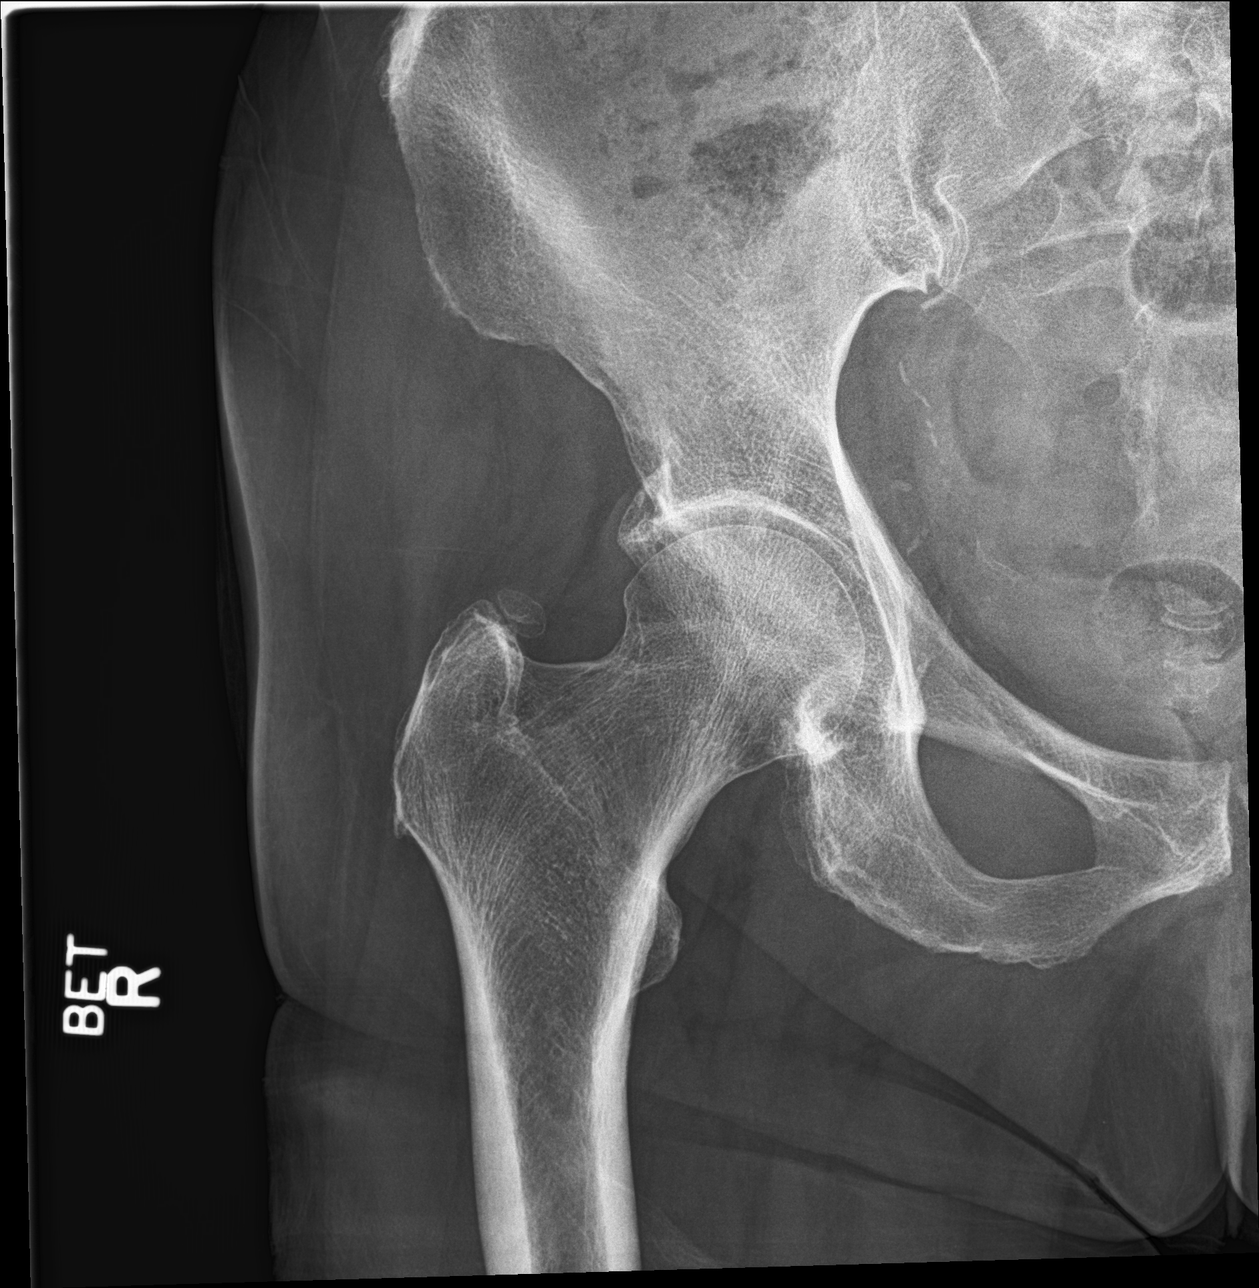

[hip frog leg]
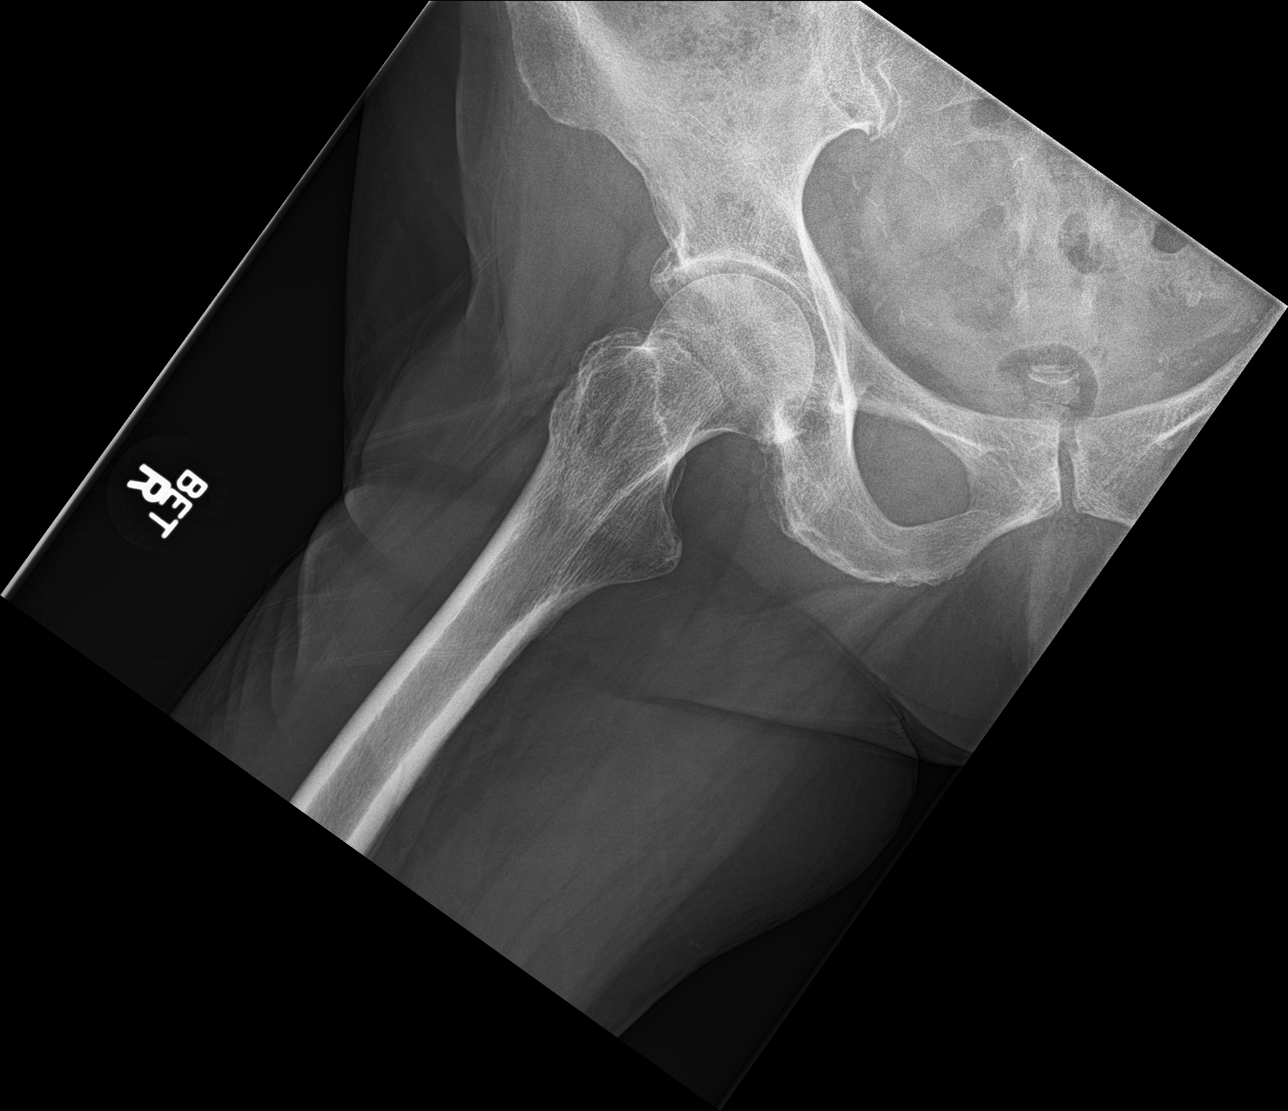

[3 of 3 positions shown; findings below may reference images not displayed]

FINDINGS: The pelvic bones are intact. There is mild bilateral hip arthritis.
No fracture or dislocation of the proximal right femur.
IMPRESSION: No acute findings

## 2016-08-16 ENCOUNTER — Encounter: Payer: Self-pay | Admitting: Family Medicine

## 2016-08-16 ENCOUNTER — Ambulatory Visit (INDEPENDENT_AMBULATORY_CARE_PROVIDER_SITE_OTHER): Payer: Medicare Other | Admitting: Family Medicine

## 2016-08-16 VITALS — BP 144/72 | Ht 62.0 in | Wt 106.5 lb

## 2016-08-16 DIAGNOSIS — K219 Gastro-esophageal reflux disease without esophagitis: Secondary | ICD-10-CM | POA: Diagnosis not present

## 2016-08-16 DIAGNOSIS — E119 Type 2 diabetes mellitus without complications: Secondary | ICD-10-CM

## 2016-08-16 DIAGNOSIS — R7301 Impaired fasting glucose: Secondary | ICD-10-CM | POA: Diagnosis not present

## 2016-08-16 DIAGNOSIS — F5101 Primary insomnia: Secondary | ICD-10-CM | POA: Diagnosis not present

## 2016-08-16 DIAGNOSIS — Z13220 Encounter for screening for lipoid disorders: Secondary | ICD-10-CM | POA: Diagnosis not present

## 2016-08-16 LAB — POCT GLYCOSYLATED HEMOGLOBIN (HGB A1C): HEMOGLOBIN A1C: 5

## 2016-08-16 NOTE — Progress Notes (Signed)
   Subjective:    Patient ID: Katherine Hoffman, female    DOB: [birth year], 76 y.o.   MRN: [id number]  Diabetes  She presents for her follow-up diabetic visit. She has type 2 diabetes mellitus. She does not see a podiatrist.Eye exam is current (08/02/16).   Patient has c/o frequent cramps bilateral feet.   Patient claims compliance with diabetes medication. No obvious side effects. Reports no substantial low sugar spells. Most numbers are generally in good range when checked fasting. Generally does not miss a dose of medication. Watching diabetic diet closely  Walking and exercising, quite  Bit  Patient compliant with insomnia medication. Generally takes most nights. No obvious morning drowsiness. Definitely helps patient sleep. Without it patient states would not get a good nights rest.    Results for orders placed or performed in visit on 08/16/16  POCT HgB A1C  Result Value Ref Range   Hemoglobin A1C 5.0     Review of Systems No headache, no major weight loss or weight gain, no chest pain no back pain abdominal pain no change in bowel habits complete ROS otherwise negative     Objective:   Physical Exam   Alert vitals stable, NAD. Blood pressure good on repeat. HEENT normal. Lungs clear. Heart regular rate and rhythm.      Assessment & Plan:  Impression 1 type 2 diabetes. A1c good control. Diet exercise discussed. Compliance of medications discussed. Follow-up is scheduled

## 2016-09-02 ENCOUNTER — Other Ambulatory Visit: Payer: Self-pay | Admitting: Family Medicine

## 2016-09-10 ENCOUNTER — Telehealth: Payer: Self-pay | Admitting: Family Medicine

## 2016-09-10 NOTE — Telephone Encounter (Signed)
Patient said that Dr. Brett CanalesSteve has had her on a healthy food diet since the beginning of the year.  She said she is now down to 102 pounds and is concerned because of the weight loss.  She wants to know what Dr. Brett CanalesSteve recommends.

## 2016-09-10 NOTE — Telephone Encounter (Signed)
o  V next chronic slot

## 2016-09-11 NOTE — Telephone Encounter (Signed)
Patient scheduled office visit to discuss weight loss  °

## 2016-09-16 ENCOUNTER — Encounter: Payer: Self-pay | Admitting: Family Medicine

## 2016-09-16 ENCOUNTER — Ambulatory Visit (INDEPENDENT_AMBULATORY_CARE_PROVIDER_SITE_OTHER): Payer: Medicare Other | Admitting: Family Medicine

## 2016-09-16 VITALS — BP 132/80 | Ht 62.0 in | Wt 106.6 lb

## 2016-09-16 DIAGNOSIS — R634 Abnormal weight loss: Secondary | ICD-10-CM

## 2016-09-16 NOTE — Progress Notes (Signed)
   Subjective:    Patient ID: Katherine Hoffman, female    DOB: 08-17-1940, 76 y.o.   MRN: [id number] Patient arrives office for a protracted discussion regarding her concerns HPI  Patient arrives to discuss recent weight loss. Patient states she was 124 lbs in April and was advised to eat healthy and she has been eating very healthy and has loss significant weight and doesn't want to lose anymore.  Patient worried about weight loss. Concerned it may be something serious.  Has occurred since the spring. At this time diagnosed with type 2 diabetes in April. After that developed a stringent diet. Also developed substantial increase exercise habits.  Up to date on colonoscopy and mammogram results discussed.  No family history of thyroid disease.  No chest pain no back pain no headache no abdominal pain.  States energy level overall decent. Review of Systems No headache, no major weight loss or weight gain, no chest pain no back pain abdominal pain no change in bowel habits complete ROS otherwise negative     Objective:   Physical Exam Alert and oriented, vitals reviewed and stable, NAD ENT-TM's and ext canals WNL bilat via otoscopic exam Soft palate, tonsils and post pharynx WNL via oropharyngeal exam Neck-symmetric, no masses; thyroid nonpalpable and nontender Pulmonary-no tachypnea or accessory muscle use; Clear without wheezes via auscultation Card--no abnrml murmurs, rhythm reg and rate WNL Carotid pulses symmetric, without bruits        Assessment & Plan:  Impression 1 weight loss. Likely due to new stringent diet and exercise efforts. Discussed at length. We'll do some screening blood work to check a few other things. We'll follow-up closer just couple months. Patient advised to liberalize her diet and increase caloric intake.  Greater than 50% of this 25 minute face to face visit was spent in counseling and discussion and coordination of care regarding the above  diagnosis/diagnosies

## 2016-09-17 LAB — CBC WITH DIFFERENTIAL/PLATELET
BASOS ABS: 0 10*3/uL (ref 0.0–0.2)
BASOS: 1 %
EOS (ABSOLUTE): 0.2 10*3/uL (ref 0.0–0.4)
Eos: 2 %
Hematocrit: 47 % — ABNORMAL HIGH (ref 34.0–46.6)
Hemoglobin: 16.3 g/dL — ABNORMAL HIGH (ref 11.1–15.9)
Immature Grans (Abs): 0 10*3/uL (ref 0.0–0.1)
Immature Granulocytes: 0 %
Lymphocytes Absolute: 1.9 10*3/uL (ref 0.7–3.1)
Lymphs: 24 %
MCH: 32.9 pg (ref 26.6–33.0)
MCHC: 34.7 g/dL (ref 31.5–35.7)
MCV: 95 fL (ref 79–97)
MONOS ABS: 1 10*3/uL — AB (ref 0.1–0.9)
Monocytes: 14 %
NEUTROS ABS: 4.6 10*3/uL (ref 1.4–7.0)
NEUTROS PCT: 59 %
PLATELETS: 271 10*3/uL (ref 150–379)
RBC: 4.96 x10E6/uL (ref 3.77–5.28)
RDW: 12.4 % (ref 12.3–15.4)
WBC: 7.7 10*3/uL (ref 3.4–10.8)

## 2016-09-17 LAB — TSH: TSH: 2.48 u[IU]/mL (ref 0.450–4.500)

## 2016-09-18 ENCOUNTER — Telehealth: Payer: Self-pay | Admitting: Family Medicine

## 2016-09-18 NOTE — Telephone Encounter (Signed)
Calling to request results to labs.

## 2016-09-18 NOTE — Telephone Encounter (Signed)
See labs 

## 2016-09-19 NOTE — Telephone Encounter (Signed)
Spoke with patient and informed her the results to her labs. Please see result note

## 2016-09-27 DIAGNOSIS — M5442 Lumbago with sciatica, left side: Secondary | ICD-10-CM | POA: Diagnosis not present

## 2016-09-27 DIAGNOSIS — M5137 Other intervertebral disc degeneration, lumbosacral region: Secondary | ICD-10-CM | POA: Diagnosis not present

## 2016-09-27 DIAGNOSIS — M25552 Pain in left hip: Secondary | ICD-10-CM | POA: Diagnosis not present

## 2016-10-14 DIAGNOSIS — M9903 Segmental and somatic dysfunction of lumbar region: Secondary | ICD-10-CM | POA: Diagnosis not present

## 2016-10-14 DIAGNOSIS — M47816 Spondylosis without myelopathy or radiculopathy, lumbar region: Secondary | ICD-10-CM | POA: Diagnosis not present

## 2016-10-14 DIAGNOSIS — M5440 Lumbago with sciatica, unspecified side: Secondary | ICD-10-CM | POA: Diagnosis not present

## 2016-10-15 DIAGNOSIS — M47816 Spondylosis without myelopathy or radiculopathy, lumbar region: Secondary | ICD-10-CM | POA: Diagnosis not present

## 2016-10-15 DIAGNOSIS — M9903 Segmental and somatic dysfunction of lumbar region: Secondary | ICD-10-CM | POA: Diagnosis not present

## 2016-10-15 DIAGNOSIS — M5440 Lumbago with sciatica, unspecified side: Secondary | ICD-10-CM | POA: Diagnosis not present

## 2016-10-16 DIAGNOSIS — M5440 Lumbago with sciatica, unspecified side: Secondary | ICD-10-CM | POA: Diagnosis not present

## 2016-10-16 DIAGNOSIS — M9903 Segmental and somatic dysfunction of lumbar region: Secondary | ICD-10-CM | POA: Diagnosis not present

## 2016-10-16 DIAGNOSIS — M47816 Spondylosis without myelopathy or radiculopathy, lumbar region: Secondary | ICD-10-CM | POA: Diagnosis not present

## 2016-10-21 DIAGNOSIS — M5440 Lumbago with sciatica, unspecified side: Secondary | ICD-10-CM | POA: Diagnosis not present

## 2016-10-21 DIAGNOSIS — M47816 Spondylosis without myelopathy or radiculopathy, lumbar region: Secondary | ICD-10-CM | POA: Diagnosis not present

## 2016-10-21 DIAGNOSIS — M9903 Segmental and somatic dysfunction of lumbar region: Secondary | ICD-10-CM | POA: Diagnosis not present

## 2016-10-22 ENCOUNTER — Other Ambulatory Visit: Payer: Self-pay | Admitting: Family Medicine

## 2016-10-23 DIAGNOSIS — M5440 Lumbago with sciatica, unspecified side: Secondary | ICD-10-CM | POA: Diagnosis not present

## 2016-10-23 DIAGNOSIS — M9903 Segmental and somatic dysfunction of lumbar region: Secondary | ICD-10-CM | POA: Diagnosis not present

## 2016-10-23 DIAGNOSIS — M47816 Spondylosis without myelopathy or radiculopathy, lumbar region: Secondary | ICD-10-CM | POA: Diagnosis not present

## 2016-10-30 DIAGNOSIS — M5137 Other intervertebral disc degeneration, lumbosacral region: Secondary | ICD-10-CM | POA: Diagnosis not present

## 2016-10-30 DIAGNOSIS — M25552 Pain in left hip: Secondary | ICD-10-CM | POA: Diagnosis not present

## 2016-10-30 DIAGNOSIS — M5442 Lumbago with sciatica, left side: Secondary | ICD-10-CM | POA: Diagnosis not present

## 2016-11-06 DIAGNOSIS — M25552 Pain in left hip: Secondary | ICD-10-CM | POA: Diagnosis not present

## 2016-11-06 DIAGNOSIS — M5137 Other intervertebral disc degeneration, lumbosacral region: Secondary | ICD-10-CM | POA: Diagnosis not present

## 2016-11-12 DIAGNOSIS — M25552 Pain in left hip: Secondary | ICD-10-CM | POA: Diagnosis not present

## 2016-11-12 DIAGNOSIS — M5442 Lumbago with sciatica, left side: Secondary | ICD-10-CM | POA: Diagnosis not present

## 2016-11-12 DIAGNOSIS — M5137 Other intervertebral disc degeneration, lumbosacral region: Secondary | ICD-10-CM | POA: Diagnosis not present

## 2016-11-13 DIAGNOSIS — M5126 Other intervertebral disc displacement, lumbar region: Secondary | ICD-10-CM | POA: Diagnosis not present

## 2016-11-13 DIAGNOSIS — M5137 Other intervertebral disc degeneration, lumbosacral region: Secondary | ICD-10-CM | POA: Diagnosis not present

## 2016-11-19 ENCOUNTER — Encounter: Payer: Self-pay | Admitting: Family Medicine

## 2016-11-19 ENCOUNTER — Ambulatory Visit (INDEPENDENT_AMBULATORY_CARE_PROVIDER_SITE_OTHER): Payer: Medicare Other | Admitting: Family Medicine

## 2016-11-19 VITALS — BP 128/86 | Ht 62.0 in | Wt 102.8 lb

## 2016-11-19 DIAGNOSIS — R634 Abnormal weight loss: Secondary | ICD-10-CM | POA: Diagnosis not present

## 2016-11-19 DIAGNOSIS — E119 Type 2 diabetes mellitus without complications: Secondary | ICD-10-CM | POA: Diagnosis not present

## 2016-11-19 LAB — POCT GLYCOSYLATED HEMOGLOBIN (HGB A1C): Hemoglobin A1C: 4.7

## 2016-11-19 NOTE — Progress Notes (Signed)
   Subjective:    Patient ID: Katherine Hoffman, female    DOB: [birth year], 76 y.o.   MRN: [id number]  HPIfollow up on weight loss. Pt states she cut sugar out of her diet.   Declines flu vaccine. Pt states she had a reaction to flu vaccine.  Results for orders placed or performed in visit on 09/16/16  TSH  Result Value Ref Range   TSH 2.480 0.450 - 4.500 uIU/mL  CBC with Differential/Platelet  Result Value Ref Range   WBC 7.7 3.4 - 10.8 x10E3/uL   RBC 4.96 3.77 - 5.28 x10E6/uL   Hemoglobin 16.3 (H) 11.1 - 15.9 g/dL   Hematocrit 78.247.0 (H) 95.634.0 - 46.6 %   MCV 95 79 - 97 fL   MCH 32.9 26.6 - 33.0 pg   MCHC 34.7 31.5 - 35.7 g/dL   RDW 21.312.4 08.612.3 - 57.815.4 %   Platelets 271 150 - 379 x10E3/uL   Neutrophils 59 Not Estab. %   Lymphs 24 Not Estab. %   Monocytes 14 Not Estab. %   Eos 2 Not Estab. %   Basos 1 Not Estab. %   Neutrophils Absolute 4.6 1.4 - 7.0 x10E3/uL   Lymphocytes Absolute 1.9 0.7 - 3.1 x10E3/uL   Monocytes Absolute 1.0 (H) 0.1 - 0.9 x10E3/uL   EOS (ABSOLUTE) 0.2 0.0 - 0.4 x10E3/uL   Basophils Absolute 0.0 0.0 - 0.2 x10E3/uL   Immature Granulocytes 0 Not Estab. %   Immature Grans (Abs) 0.0 0.0 - 0.1 x10E3/uL   Results for orders placed or performed in visit on 11/19/16  POCT glycosylated hemoglobin (Hb A1C)  Result Value Ref Range   Hemoglobin A1C 4.7     .diiab Patient claims compliance with diabetes medication. No obvious side effects. Reports no substantial low sugar spells. Most numbers are generally in good range when checked fasting. Generally does not miss a dose of medication. Watching diabetic diet closely   Review of Systems No headache, no major weight loss or weight gain, no chest pain no back pain abdominal pain no change in bowel habits complete ROS otherwise negative     Objective:   Physical Exam  Alert and oriented, vitals reviewed and stable, NAD ENT-TM's and ext canals WNL bilat via otoscopic exam Soft palate, tonsils and post pharynx WNL  via oropharyngeal exam Neck-symmetric, no masses; thyroid nonpalpable and nontender Pulmonary-no tachypnea or accessory muscle use; Clear without wheezes via auscultation Card--no abnrml murmurs, rhythm reg and rate WNL Carotid pulses symmetric, without bruits       Assessment & Plan:  Impression 1 weight loss concern to patient. Understandable. Up-to-date on colonoscopy. No GI respiratory features. Just a very tight diet in the past year with diabetes diagnosis. I think this is etiology of patient's weight loss. Discussed. Of note trajectory of weight loss has slowed down and past several months  #2 type 2 diabetes discussed length good control advised patient to liberalize food intake and calorie intake.  Follow-up in 6 months as scheduled diet exercise discussed.  Greater than 50% of this 25 minute face to face visit was spent in counseling and discussion and coordination of care regarding the above diagnosis/diagnosies

## 2016-11-30 DIAGNOSIS — M5126 Other intervertebral disc displacement, lumbar region: Secondary | ICD-10-CM | POA: Diagnosis not present

## 2016-11-30 DIAGNOSIS — M5137 Other intervertebral disc degeneration, lumbosacral region: Secondary | ICD-10-CM | POA: Diagnosis not present

## 2016-12-10 DIAGNOSIS — X32XXXD Exposure to sunlight, subsequent encounter: Secondary | ICD-10-CM | POA: Diagnosis not present

## 2016-12-10 DIAGNOSIS — L570 Actinic keratosis: Secondary | ICD-10-CM | POA: Diagnosis not present

## 2016-12-23 ENCOUNTER — Other Ambulatory Visit: Payer: Self-pay | Admitting: Family Medicine

## 2016-12-23 NOTE — Telephone Encounter (Signed)
Last seen 11/19/16

## 2017-01-23 ENCOUNTER — Other Ambulatory Visit: Payer: Self-pay | Admitting: Family Medicine

## 2017-01-23 NOTE — Telephone Encounter (Signed)
Six mo ok 

## 2017-01-24 ENCOUNTER — Other Ambulatory Visit: Payer: Self-pay | Admitting: *Deleted

## 2017-01-24 ENCOUNTER — Telehealth: Payer: Self-pay | Admitting: Family Medicine

## 2017-01-24 MED ORDER — ALPRAZOLAM 1 MG PO TABS: ORAL_TABLET | ORAL | 5 refills | Status: DC

## 2017-01-24 NOTE — Telephone Encounter (Signed)
Ok six mo 

## 2017-01-24 NOTE — Telephone Encounter (Signed)
Pt.notified

## 2017-01-24 NOTE — Telephone Encounter (Signed)
Pt is needing a refill on  ALPRAZolam (XANAX) 1 MG tablet Pt states that she only has one pill left. A medication refill request was sent over electronically yesterday from [facility].   [facility]

## 2017-01-24 NOTE — Telephone Encounter (Signed)
Last check up 11/19/16

## 2017-01-24 NOTE — Telephone Encounter (Signed)
Script printed. Await dr signature so we can fax and then let pt know

## 2017-02-17 ENCOUNTER — Ambulatory Visit: Payer: Medicare Other | Admitting: Family Medicine

## 2017-02-19 ENCOUNTER — Telehealth: Payer: Self-pay | Admitting: Family Medicine

## 2017-02-19 NOTE — Telephone Encounter (Signed)
Call us back in mid march will give orders then, dont want to do too early, this pt can get a bit confused at times and im concerned will go and do b w too soon

## 2017-02-19 NOTE — Telephone Encounter (Signed)
Patient notified and stated she will call back mid march to have blood work ordered.

## 2017-02-19 NOTE — Telephone Encounter (Signed)
Patient has had Bmet,Hepatic function,Lipid done 06/01/2016,had Cbc,Tsh 09/16/2016,A1c 11/19/2016.Please advise.

## 2017-02-19 NOTE — Telephone Encounter (Signed)
Patient has an appointment in April with Dr. Brett CanalesSteve.  She is requesting orders for labs.

## 2017-03-03 ENCOUNTER — Other Ambulatory Visit: Payer: Self-pay | Admitting: *Deleted

## 2017-03-03 ENCOUNTER — Encounter: Payer: Self-pay | Admitting: Family Medicine

## 2017-03-03 DIAGNOSIS — Z1231 Encounter for screening mammogram for malignant neoplasm of breast: Secondary | ICD-10-CM | POA: Diagnosis not present

## 2017-03-03 MED ORDER — NAPROXEN 500 MG PO TABS: ORAL_TABLET | ORAL | 3 refills | Status: DC

## 2017-03-04 ENCOUNTER — Telehealth: Payer: Self-pay | Admitting: Family Medicine

## 2017-03-04 NOTE — Telephone Encounter (Signed)
Review screening mammogram results in results folder from [facility]. °

## 2017-03-13 ENCOUNTER — Other Ambulatory Visit: Payer: Self-pay | Admitting: *Deleted

## 2017-03-13 ENCOUNTER — Telehealth: Payer: Self-pay | Admitting: Family Medicine

## 2017-03-13 DIAGNOSIS — R69 Illness, unspecified: Secondary | ICD-10-CM | POA: Diagnosis not present

## 2017-03-13 NOTE — Telephone Encounter (Signed)
Patient said that Dr. Brett CanalesSteve originally told her in September last year to check her blood sugar 1 time a week.  She said that she went to the classes at [facility] and was told by the nutritionist that she needs to check it 2 times a week to accurately figure out her diet.  She is running out of test strips and needs a new Rx sent to the pharmacy to show that she should check it 2 times a week.  She has 1 test strip left.  [facility]

## 2017-03-13 NOTE — Telephone Encounter (Signed)
Patient states she uses Accu check [employer] [facility] and would like it sent into [facility]. Prescription faxed to pharmacy.

## 2017-03-13 NOTE — Telephone Encounter (Signed)
That's fine  Ref prn

## 2017-03-14 ENCOUNTER — Telehealth: Payer: Self-pay | Admitting: Family Medicine

## 2017-03-14 NOTE — Telephone Encounter (Signed)
Requesting Rx for one touch verio meter.   [facility] [place] [facility]

## 2017-03-15 DIAGNOSIS — R69 Illness, unspecified: Secondary | ICD-10-CM | POA: Diagnosis not present

## 2017-03-17 ENCOUNTER — Other Ambulatory Visit: Payer: Self-pay | Admitting: *Deleted

## 2017-03-17 MED ORDER — BLOOD GLUCOSE MONITOR KIT: PACK | 0 refills | Status: DC

## 2017-03-17 NOTE — Telephone Encounter (Signed)
Pt states she spoke with her insurance and they will cover. Script printed. Await dr steve's signature to fax. Pt notified we will fax over today.

## 2017-03-17 NOTE — Telephone Encounter (Signed)
Then plz spkj with pt and let her know medicare only allows one new monitor per yr at most(May be two yrs now)

## 2017-04-14 ENCOUNTER — Telehealth: Payer: Self-pay | Admitting: Family Medicine

## 2017-04-14 DIAGNOSIS — Z79899 Other long term (current) drug therapy: Secondary | ICD-10-CM

## 2017-04-14 DIAGNOSIS — Z13220 Encounter for screening for lipoid disorders: Secondary | ICD-10-CM

## 2017-04-14 DIAGNOSIS — E119 Type 2 diabetes mellitus without complications: Secondary | ICD-10-CM

## 2017-04-14 NOTE — Telephone Encounter (Signed)
02/2016: Lipid, Liver and Met 7 08/2016: TSH, CBC, HgbA1c 10/2016: HgbA1c

## 2017-04-14 NOTE — Telephone Encounter (Signed)
Met 7 lip liv A1c cbc

## 2017-04-14 NOTE — Telephone Encounter (Signed)
Patient has wellness coming up in April and needing labs done.She wanted to know if she needs to fast also.

## 2017-04-15 NOTE — Telephone Encounter (Signed)
Discussed with pt. Pt verbalized understanding.  °

## 2017-04-24 ENCOUNTER — Other Ambulatory Visit: Payer: Self-pay | Admitting: Family Medicine

## 2017-05-13 DIAGNOSIS — Z13220 Encounter for screening for lipoid disorders: Secondary | ICD-10-CM | POA: Diagnosis not present

## 2017-05-13 DIAGNOSIS — Z79899 Other long term (current) drug therapy: Secondary | ICD-10-CM | POA: Diagnosis not present

## 2017-05-13 DIAGNOSIS — E119 Type 2 diabetes mellitus without complications: Secondary | ICD-10-CM | POA: Diagnosis not present

## 2017-05-14 LAB — BASIC METABOLIC PANEL
BUN/Creatinine Ratio: 15 (ref 12–28)
BUN: 11 mg/dL (ref 8–27)
CALCIUM: 10 mg/dL (ref 8.7–10.3)
CO2: 27 mmol/L (ref 20–29)
Chloride: 101 mmol/L (ref 96–106)
Creatinine, Ser: 0.73 mg/dL (ref 0.57–1.00)
GFR calc Af Amer: 93 mL/min/{1.73_m2} (ref >59)
GFR calc non Af Amer: 80 mL/min/{1.73_m2} (ref >59)
GLUCOSE: 106 mg/dL — AB (ref 65–99)
Potassium: 4.5 mmol/L (ref 3.5–5.2)
Sodium: 143 mmol/L (ref 134–144)

## 2017-05-14 LAB — CBC WITH DIFFERENTIAL/PLATELET
BASOS ABS: 0.1 10*3/uL (ref 0.0–0.2)
Basos: 1 %
EOS (ABSOLUTE): 0.3 10*3/uL (ref 0.0–0.4)
Eos: 4 %
Hematocrit: 48.1 % — ABNORMAL HIGH (ref 34.0–46.6)
Hemoglobin: 16.3 g/dL — ABNORMAL HIGH (ref 11.1–15.9)
IMMATURE GRANS (ABS): 0 10*3/uL (ref 0.0–0.1)
Immature Granulocytes: 0 %
LYMPHS: 28 %
Lymphocytes Absolute: 2.2 10*3/uL (ref 0.7–3.1)
MCH: 32 pg (ref 26.6–33.0)
MCHC: 33.9 g/dL (ref 31.5–35.7)
MCV: 94 fL (ref 79–97)
MONOCYTES: 11 %
Monocytes Absolute: 0.8 10*3/uL (ref 0.1–0.9)
Neutrophils Absolute: 4.4 10*3/uL (ref 1.4–7.0)
Neutrophils: 56 %
Platelets: 283 10*3/uL (ref 150–379)
RBC: 5.1 x10E6/uL (ref 3.77–5.28)
RDW: 12.8 % (ref 12.3–15.4)
WBC: 7.8 10*3/uL (ref 3.4–10.8)

## 2017-05-14 LAB — HEMOGLOBIN A1C
ESTIMATED AVERAGE GLUCOSE: 108 mg/dL
Hgb A1c MFr Bld: 5.4 % (ref 4.8–5.6)

## 2017-05-14 LAB — HEPATIC FUNCTION PANEL
ALBUMIN: 4.6 g/dL (ref 3.5–4.8)
ALK PHOS: 93 IU/L (ref 39–117)
ALT: 14 IU/L (ref 0–32)
AST: 19 IU/L (ref 0–40)
Bilirubin Total: 0.6 mg/dL (ref 0.0–1.2)
Bilirubin, Direct: 0.17 mg/dL (ref 0.00–0.40)
TOTAL PROTEIN: 7 g/dL (ref 6.0–8.5)

## 2017-05-14 LAB — LIPID PANEL
Chol/HDL Ratio: 5 ratio — ABNORMAL HIGH (ref 0.0–4.4)
Cholesterol, Total: 208 mg/dL — ABNORMAL HIGH (ref 100–199)
HDL: 42 mg/dL (ref >39)
LDL CALC: 125 mg/dL — AB (ref 0–99)
Triglycerides: 205 mg/dL — ABNORMAL HIGH (ref 0–149)
VLDL Cholesterol Cal: 41 mg/dL — ABNORMAL HIGH (ref 5–40)

## 2017-05-17 ENCOUNTER — Other Ambulatory Visit: Payer: Self-pay | Admitting: Family Medicine

## 2017-05-17 DIAGNOSIS — H6121 Impacted cerumen, right ear: Secondary | ICD-10-CM | POA: Diagnosis not present

## 2017-05-17 DIAGNOSIS — H61891 Other specified disorders of right external ear: Secondary | ICD-10-CM | POA: Diagnosis not present

## 2017-05-20 ENCOUNTER — Encounter: Payer: Self-pay | Admitting: Family Medicine

## 2017-05-20 ENCOUNTER — Ambulatory Visit (INDEPENDENT_AMBULATORY_CARE_PROVIDER_SITE_OTHER): Payer: Medicare HMO | Admitting: Family Medicine

## 2017-05-20 VITALS — BP 136/84 | Ht 62.0 in | Wt 109.8 lb

## 2017-05-20 DIAGNOSIS — R69 Illness, unspecified: Secondary | ICD-10-CM | POA: Diagnosis not present

## 2017-05-20 DIAGNOSIS — E1169 Type 2 diabetes mellitus with other specified complication: Secondary | ICD-10-CM | POA: Insufficient documentation

## 2017-05-20 DIAGNOSIS — F5101 Primary insomnia: Secondary | ICD-10-CM | POA: Diagnosis not present

## 2017-05-20 DIAGNOSIS — E785 Hyperlipidemia, unspecified: Secondary | ICD-10-CM | POA: Diagnosis not present

## 2017-05-20 DIAGNOSIS — Z0001 Encounter for general adult medical examination with abnormal findings: Secondary | ICD-10-CM

## 2017-05-20 DIAGNOSIS — Z0000 Encounter for general adult medical examination without abnormal findings: Secondary | ICD-10-CM

## 2017-05-20 DIAGNOSIS — E119 Type 2 diabetes mellitus without complications: Secondary | ICD-10-CM | POA: Diagnosis not present

## 2017-05-20 MED ORDER — ALPRAZOLAM 1 MG PO TABS: ORAL_TABLET | ORAL | 5 refills | Status: DC

## 2017-05-20 MED ORDER — ATORVASTATIN CALCIUM 20 MG PO TABS: 20.0000 mg | ORAL_TABLET | Freq: Every day | ORAL | 5 refills | Status: DC

## 2017-05-20 NOTE — Progress Notes (Signed)
Subjective:    Patient ID: Katherine Hoffman, female    DOB: [birth year], 77 y.o.   MRN: [id number]  HPI The patient comes in today for a wellness visit.    A review of their health history was completed.  A review of medications was also completed.  Any needed refills; yes  Eating habits: eating health  Falls/  MVA accidents in past few months: none  Mini cog- passed  Regular exercise: walking and stretching-lifting weights  Specialist pt sees on regular basis: Dr Ramos-arthritis  Preventative health issues were discussed.   Additional concerns: Constipation  Follow up on diabetes-see recent labs  Patient claims compliance with diabetes regimen No obvious side effects. Reports no substantial low sugar spells. Most numbers are generally in good range when checked fasting. Generally does not miss a dose of medication. Watching diabetic diet closely  Results for orders placed or performed in visit on 04/14/17  Lipid panel  Result Value Ref Range   Cholesterol, Total 208 (H) 100 - 199 mg/dL   Triglycerides 409205 (H) 0 - 149 mg/dL   HDL 42 >81>39 mg/dL   VLDL Cholesterol Cal 41 (H) 5 - 40 mg/dL   LDL Calculated 191125 (H) 0 - 99 mg/dL   Chol/HDL Ratio 5.0 (H) 0.0 - 4.4 ratio  Hepatic function panel  Result Value Ref Range   Total Protein 7.0 6.0 - 8.5 g/dL   Albumin 4.6 3.5 - 4.8 g/dL   Bilirubin Total 0.6 0.0 - 1.2 mg/dL   Bilirubin, Direct 4.780.17 0.00 - 0.40 mg/dL   Alkaline Phosphatase 93 39 - 117 IU/L   AST 19 0 - 40 IU/L   ALT 14 0 - 32 IU/L  Hemoglobin A1c  Result Value Ref Range   Hgb A1c MFr Bld 5.4 4.8 - 5.6 %   Est. average glucose Bld gHb Est-mCnc 108 mg/dL  CBC with Differential/Platelet  Result Value Ref Range   WBC 7.8 3.4 - 10.8 x10E3/uL   RBC 5.10 3.77 - 5.28 x10E6/uL   Hemoglobin 16.3 (H) 11.1 - 15.9 g/dL   Hematocrit 29.548.1 (H) 62.134.0 - 46.6 %   MCV 94 79 - 97 fL   MCH 32.0 26.6 - 33.0 pg   MCHC 33.9 31.5 - 35.7 g/dL   RDW 30.812.8 65.712.3 - 84.615.4 %   Platelets 283 150 - 379 x10E3/uL   Neutrophils 56 Not Estab. %   Lymphs 28 Not Estab. %   Monocytes 11 Not Estab. %   Eos 4 Not Estab. %   Basos 1 Not Estab. %   Neutrophils Absolute 4.4 1.4 - 7.0 x10E3/uL   Lymphocytes Absolute 2.2 0.7 - 3.1 x10E3/uL   Monocytes Absolute 0.8 0.1 - 0.9 x10E3/uL   EOS (ABSOLUTE) 0.3 0.0 - 0.4 x10E3/uL   Basophils Absolute 0.1 0.0 - 0.2 x10E3/uL   Immature Granulocytes 0 Not Estab. %   Immature Grans (Abs) 0.0 0.0 - 0.1 x10E3/uL  Basic metabolic panel  Result Value Ref Range   Glucose 106 (H) 65 - 99 mg/dL   BUN 11 8 - 27 mg/dL   Creatinine, Ser 9.620.73 0.57 - 1.00 mg/dL   GFR calc non Af Amer 80 >59 mL/min/1.73   GFR calc Af Amer 93 >59 mL/min/1.73   BUN/Creatinine Ratio 15 12 - 28   Sodium 143 134 - 144 mmol/L   Potassium 4.5 3.5 - 5.2 mmol/L   Chloride 101 96 - 106 mmol/L   CO2 27 20 - 29 mmol/L   Calcium  10.0 8.7 - 10.3 mg/dL   Exercising reg, working staying active  Patient compliant with insomnia medication. Generally takes most nights. No obvious morning drowsiness. Definitely helps patient sleep. Without it patient states would not get a good nights rest.  Fair amnt of constipation     Review of Systems  Constitutional: Negative for activity change, appetite change and fatigue.  HENT: Negative for congestion and rhinorrhea.   Eyes: Negative for discharge.  Respiratory: Negative for cough, chest tightness and wheezing.   Cardiovascular: Negative for chest pain.  Gastrointestinal: Negative for abdominal pain, blood in stool and vomiting.  Endocrine: Negative for polyphagia.  Genitourinary: Negative for difficulty urinating and frequency.  Musculoskeletal: Negative for neck pain.  Skin: Negative for color change.  Allergic/Immunologic: Negative for environmental allergies and food allergies.  Neurological: Negative for weakness and headaches.  Psychiatric/Behavioral: Negative for agitation and behavioral problems.  All other systems  reviewed and are negative.      Objective:   Physical Exam  Constitutional: She is oriented to person, place, and time. She appears well-developed and well-nourished.  HENT:  Head: Normocephalic and atraumatic.  Right Ear: External ear normal.  Left Ear: External ear normal.  Eyes: Right eye exhibits no discharge. Left eye exhibits no discharge.  Neck: Normal range of motion. No tracheal deviation present.  Cardiovascular: Normal rate, regular rhythm, normal heart sounds and intact distal pulses. Exam reveals no gallop.  No murmur heard. Pulmonary/Chest: Effort normal and breath sounds normal. No stridor. No respiratory distress. She has no wheezes. She has no rales.  Abdominal: Soft. Bowel sounds are normal. She exhibits no distension and no mass. There is no tenderness. There is no rebound and no guarding.  Musculoskeletal: Normal range of motion. She exhibits no edema or tenderness.  Lymphadenopathy:    She has no cervical adenopathy.  Neurological: She is alert and oriented to person, place, and time. She exhibits normal muscle tone.  Skin: Skin is warm and dry.  Psychiatric: She has a normal mood and affect. Her behavior is normal.  Vitals reviewed.         Assessment & Plan:  #1 wellness exam.  Patient declines pelvic.  Patient declines rectal exam.  Patient declines all vaccines.  Fortunately she still gets her mammograms.  Exercise discussed  2.  Type 2 diabetes.  A1c excellent fasting sugars.  Diet reviewed to maintain same approach  3.  Hyperlipidemia.  Time for intervention.  Long discussion held.  Patient agrees we will initiate Lipitor 20 daily  4.  Insomnia stable discussed  Follow-up every 6 months diet exercise discussed encouraged Dr. visit

## 2017-05-20 NOTE — Patient Instructions (Signed)
miralax one scoop daily with glass of water should help the constipation

## 2017-06-08 DIAGNOSIS — R69 Illness, unspecified: Secondary | ICD-10-CM | POA: Diagnosis not present

## 2017-06-15 ENCOUNTER — Other Ambulatory Visit: Payer: Self-pay | Admitting: Family Medicine

## 2017-06-26 DIAGNOSIS — R69 Illness, unspecified: Secondary | ICD-10-CM | POA: Diagnosis not present

## 2017-07-10 ENCOUNTER — Other Ambulatory Visit: Payer: Self-pay | Admitting: Family Medicine

## 2017-09-03 DIAGNOSIS — H1851 Endothelial corneal dystrophy: Secondary | ICD-10-CM | POA: Diagnosis not present

## 2017-09-03 DIAGNOSIS — Z961 Presence of intraocular lens: Secondary | ICD-10-CM | POA: Diagnosis not present

## 2017-09-04 ENCOUNTER — Other Ambulatory Visit: Payer: Self-pay | Admitting: Family Medicine

## 2017-10-03 ENCOUNTER — Telehealth: Payer: Self-pay | Admitting: Family Medicine

## 2017-10-03 DIAGNOSIS — E785 Hyperlipidemia, unspecified: Secondary | ICD-10-CM

## 2017-10-03 DIAGNOSIS — E119 Type 2 diabetes mellitus without complications: Secondary | ICD-10-CM

## 2017-10-03 DIAGNOSIS — E1169 Type 2 diabetes mellitus with other specified complication: Secondary | ICD-10-CM

## 2017-10-03 DIAGNOSIS — Z79899 Other long term (current) drug therapy: Secondary | ICD-10-CM

## 2017-10-03 NOTE — Telephone Encounter (Signed)
Pt has an appt scheduled for 10/17/17 with Dr. Brett Canales for a 6 month follow up. Pt is calling in regards of wanting to complete a set of blood work before appt date. Please advise and notify pt.

## 2017-10-03 NOTE — Telephone Encounter (Signed)
Last Labs 04/2017:  Lipid, Liver, Met 7, CBC and HgbA1c

## 2017-10-04 NOTE — Telephone Encounter (Signed)
Repeat same plz

## 2017-10-06 NOTE — Telephone Encounter (Signed)
Orders sent

## 2017-10-06 NOTE — Telephone Encounter (Signed)
I spoke with pt and she is aware. 

## 2017-10-14 DIAGNOSIS — E119 Type 2 diabetes mellitus without complications: Secondary | ICD-10-CM | POA: Diagnosis not present

## 2017-10-14 DIAGNOSIS — Z79899 Other long term (current) drug therapy: Secondary | ICD-10-CM | POA: Diagnosis not present

## 2017-10-14 DIAGNOSIS — E785 Hyperlipidemia, unspecified: Secondary | ICD-10-CM | POA: Diagnosis not present

## 2017-10-14 DIAGNOSIS — E1169 Type 2 diabetes mellitus with other specified complication: Secondary | ICD-10-CM | POA: Diagnosis not present

## 2017-10-15 LAB — LIPID PANEL
Chol/HDL Ratio: 2.7 ratio (ref 0.0–4.4)
Cholesterol, Total: 137 mg/dL (ref 100–199)
HDL: 51 mg/dL (ref >39)
LDL CALC: 65 mg/dL (ref 0–99)
Triglycerides: 105 mg/dL (ref 0–149)
VLDL CHOLESTEROL CAL: 21 mg/dL (ref 5–40)

## 2017-10-15 LAB — CBC WITH DIFFERENTIAL/PLATELET
Basophils Absolute: 0.1 10*3/uL (ref 0.0–0.2)
Basos: 1 %
EOS (ABSOLUTE): 0.2 10*3/uL (ref 0.0–0.4)
Eos: 4 %
HEMOGLOBIN: 16.3 g/dL — AB (ref 11.1–15.9)
Hematocrit: 47.4 % — ABNORMAL HIGH (ref 34.0–46.6)
Immature Grans (Abs): 0 10*3/uL (ref 0.0–0.1)
Immature Granulocytes: 0 %
LYMPHS ABS: 1.8 10*3/uL (ref 0.7–3.1)
Lymphs: 27 %
MCH: 32.5 pg (ref 26.6–33.0)
MCHC: 34.4 g/dL (ref 31.5–35.7)
MCV: 94 fL (ref 79–97)
Monocytes Absolute: 0.7 10*3/uL (ref 0.1–0.9)
Monocytes: 11 %
Neutrophils Absolute: 3.8 10*3/uL (ref 1.4–7.0)
Neutrophils: 57 %
PLATELETS: 255 10*3/uL (ref 150–450)
RBC: 5.02 x10E6/uL (ref 3.77–5.28)
RDW: 11.9 % — ABNORMAL LOW (ref 12.3–15.4)
WBC: 6.6 10*3/uL (ref 3.4–10.8)

## 2017-10-15 LAB — BASIC METABOLIC PANEL
BUN / CREAT RATIO: 11 — AB (ref 12–28)
BUN: 10 mg/dL (ref 8–27)
CO2: 27 mmol/L (ref 20–29)
Calcium: 10.3 mg/dL (ref 8.7–10.3)
Chloride: 101 mmol/L (ref 96–106)
Creatinine, Ser: 0.87 mg/dL (ref 0.57–1.00)
GFR calc Af Amer: 74 mL/min/{1.73_m2} (ref >59)
GFR calc non Af Amer: 64 mL/min/{1.73_m2} (ref >59)
Glucose: 120 mg/dL — ABNORMAL HIGH (ref 65–99)
POTASSIUM: 5.2 mmol/L (ref 3.5–5.2)
Sodium: 144 mmol/L (ref 134–144)

## 2017-10-15 LAB — HEPATIC FUNCTION PANEL
ALT: 21 IU/L (ref 0–32)
AST: 23 IU/L (ref 0–40)
Albumin: 4.7 g/dL (ref 3.5–4.8)
Alkaline Phosphatase: 89 IU/L (ref 39–117)
Bilirubin Total: 0.8 mg/dL (ref 0.0–1.2)
Bilirubin, Direct: 0.23 mg/dL (ref 0.00–0.40)
TOTAL PROTEIN: 7 g/dL (ref 6.0–8.5)

## 2017-10-15 LAB — HEMOGLOBIN A1C
Est. average glucose Bld gHb Est-mCnc: 117 mg/dL
Hgb A1c MFr Bld: 5.7 % — ABNORMAL HIGH (ref 4.8–5.6)

## 2017-10-17 ENCOUNTER — Other Ambulatory Visit: Payer: Self-pay | Admitting: *Deleted

## 2017-10-17 ENCOUNTER — Encounter: Payer: Self-pay | Admitting: Family Medicine

## 2017-10-17 ENCOUNTER — Ambulatory Visit (INDEPENDENT_AMBULATORY_CARE_PROVIDER_SITE_OTHER): Payer: Medicare HMO | Admitting: Family Medicine

## 2017-10-17 ENCOUNTER — Telehealth: Payer: Self-pay | Admitting: Family Medicine

## 2017-10-17 VITALS — BP 132/80 | Ht 62.0 in | Wt 108.2 lb

## 2017-10-17 DIAGNOSIS — K219 Gastro-esophageal reflux disease without esophagitis: Secondary | ICD-10-CM

## 2017-10-17 DIAGNOSIS — E119 Type 2 diabetes mellitus without complications: Secondary | ICD-10-CM | POA: Diagnosis not present

## 2017-10-17 DIAGNOSIS — E1169 Type 2 diabetes mellitus with other specified complication: Secondary | ICD-10-CM

## 2017-10-17 DIAGNOSIS — F5101 Primary insomnia: Secondary | ICD-10-CM

## 2017-10-17 DIAGNOSIS — E785 Hyperlipidemia, unspecified: Secondary | ICD-10-CM | POA: Diagnosis not present

## 2017-10-17 DIAGNOSIS — R69 Illness, unspecified: Secondary | ICD-10-CM | POA: Diagnosis not present

## 2017-10-17 MED ORDER — OMEPRAZOLE 40 MG PO CPDR: 40.0000 mg | DELAYED_RELEASE_CAPSULE | Freq: Every day | ORAL | 5 refills | Status: DC

## 2017-10-17 MED ORDER — ATORVASTATIN CALCIUM 20 MG PO TABS: 20.0000 mg | ORAL_TABLET | Freq: Every day | ORAL | 5 refills | Status: DC

## 2017-10-17 MED ORDER — ALPRAZOLAM 1 MG PO TABS: ORAL_TABLET | ORAL | 5 refills | Status: DC

## 2017-10-17 NOTE — Telephone Encounter (Signed)
[facility] and [facility] and [facility].

## 2017-10-17 NOTE — Telephone Encounter (Signed)
Patient is requesting all medications to [facility] [address], [place], [place] [id number]

## 2017-10-17 NOTE — Telephone Encounter (Signed)
Pt.notified

## 2017-10-17 NOTE — Progress Notes (Signed)
Subjective:    Patient ID: Katherine Hoffman, female    DOB: 08-07-1940, 77 y.o.   MRN: [id number]  Diabetes  She presents for her follow-up diabetic visit. She has type 2 diabetes mellitus. There are no hypoglycemic associated symptoms. There are no diabetic associated symptoms. There are no hypoglycemic complications. There are no diabetic complications. She does not see a podiatrist.Eye exam is current.  Hyperlipidemia  There are no compliance problems.    Pt here for 6 month follow up.  Results for orders placed or performed in visit on 10/03/17  Hepatic function panel  Result Value Ref Range   Total Protein 7.0 6.0 - 8.5 g/dL   Albumin 4.7 3.5 - 4.8 g/dL   Bilirubin Total 0.8 0.0 - 1.2 mg/dL   Bilirubin, Direct 4.090.23 0.00 - 0.40 mg/dL   Alkaline Phosphatase 89 39 - 117 IU/L   AST 23 0 - 40 IU/L   ALT 21 0 - 32 IU/L  Lipid panel  Result Value Ref Range   Cholesterol, Total 137 100 - 199 mg/dL   Triglycerides 811105 0 - 149 mg/dL   HDL 51 >91>39 mg/dL   VLDL Cholesterol Cal 21 5 - 40 mg/dL   LDL Calculated 65 0 - 99 mg/dL   Chol/HDL Ratio 2.7 0.0 - 4.4 ratio  Basic metabolic panel  Result Value Ref Range   Glucose 120 (H) 65 - 99 mg/dL   BUN 10 8 - 27 mg/dL   Creatinine, Ser 4.780.87 0.57 - 1.00 mg/dL   GFR calc non Af Amer 64 >59 mL/min/1.73   GFR calc Af Amer 74 >59 mL/min/1.73   BUN/Creatinine Ratio 11 (L) 12 - 28   Sodium 144 134 - 144 mmol/L   Potassium 5.2 3.5 - 5.2 mmol/L   Chloride 101 96 - 106 mmol/L   CO2 27 20 - 29 mmol/L   Calcium 10.3 8.7 - 10.3 mg/dL  CBC with Differential/Platelet  Result Value Ref Range   WBC 6.6 3.4 - 10.8 x10E3/uL   RBC 5.02 3.77 - 5.28 x10E6/uL   Hemoglobin 16.3 (H) 11.1 - 15.9 g/dL   Hematocrit 29.547.4 (H) 62.134.0 - 46.6 %   MCV 94 79 - 97 fL   MCH 32.5 26.6 - 33.0 pg   MCHC 34.4 31.5 - 35.7 g/dL   RDW 30.811.9 (L) 65.712.3 - 84.615.4 %   Platelets 255 150 - 450 x10E3/uL   Neutrophils 57 Not Estab. %   Lymphs 27 Not Estab. %   Monocytes 11 Not Estab.  %   Eos 4 Not Estab. %   Basos 1 Not Estab. %   Neutrophils Absolute 3.8 1.4 - 7.0 x10E3/uL   Lymphocytes Absolute 1.8 0.7 - 3.1 x10E3/uL   Monocytes Absolute 0.7 0.1 - 0.9 x10E3/uL   EOS (ABSOLUTE) 0.2 0.0 - 0.4 x10E3/uL   Basophils Absolute 0.1 0.0 - 0.2 x10E3/uL   Immature Granulocytes 0 Not Estab. %   Immature Grans (Abs) 0.0 0.0 - 0.1 x10E3/uL  Hemoglobin A1c  Result Value Ref Range   Hgb A1c MFr Bld 5.7 (H) 4.8 - 5.6 %   Est. average glucose Bld gHb Est-mCnc 117 mg/dL   Patient claims compliance with diabetes medication. No obvious side effects. Reports no substantial low sugar spells. Most numbers are generally in good range when checked fasting. Generally does not miss a dose of medication. Watching diabetic diet closely  Patient continues to take lipid medication regularly. No obvious side effects from it. Generally does not miss  a dose. Prior blood work results are reviewed with patient. Patient continues to work on fat intake in diet  Patient compliant with insomnia medication. Generally takes most nights. No obvious morning drowsiness. Definitely helps patient sleep. Without it patient states would not get a good nights rest.   Review of Systems No headache, no major weight loss or weight gain, no chest pain no back pain abdominal pain no change in bowel habits complete ROS otherwise negative     Objective:   Physical Exam  Alert and oriented, vitals reviewed and stable, NAD ENT-TM's and ext canals WNL bilat via otoscopic exam Soft palate, tonsils and post pharynx WNL via oropharyngeal exam Neck-symmetric, no masses; thyroid nonpalpable and nontender Pulmonary-no tachypnea or accessory muscle use; Clear without wheezes via auscultation Card--no abnrml murmurs, rhythm reg and rate WNL Carotid pulses symmetric, without bruits       Assessment & Plan:  Impression type 2 diabetes.  Good control discussed.  Maintain same approach.  Diet exercise discussed eye doctor  encouraged  2.  Hyperlipidemia.  Prior blood work reviewed.  Patient maintain same dose  3.  Insomnia ongoing.  With need for medications discussed will maintain side effects discussed  4.  Reflux ongoing medication refill  Flu shot discussed but declined

## 2017-10-26 ENCOUNTER — Other Ambulatory Visit: Payer: Self-pay | Admitting: Family Medicine

## 2017-12-01 DIAGNOSIS — H5310 Unspecified subjective visual disturbances: Secondary | ICD-10-CM | POA: Diagnosis not present

## 2017-12-01 DIAGNOSIS — H04123 Dry eye syndrome of bilateral lacrimal glands: Secondary | ICD-10-CM | POA: Diagnosis not present

## 2017-12-01 DIAGNOSIS — Z961 Presence of intraocular lens: Secondary | ICD-10-CM | POA: Diagnosis not present

## 2017-12-01 DIAGNOSIS — H5203 Hypermetropia, bilateral: Secondary | ICD-10-CM | POA: Diagnosis not present

## 2018-01-29 ENCOUNTER — Ambulatory Visit (INDEPENDENT_AMBULATORY_CARE_PROVIDER_SITE_OTHER): Payer: Medicare HMO | Admitting: Family Medicine

## 2018-01-29 ENCOUNTER — Encounter: Payer: Self-pay | Admitting: Family Medicine

## 2018-01-29 VITALS — Temp 98.6°F | Wt 111.8 lb

## 2018-01-29 DIAGNOSIS — M2669 Other specified disorders of temporomandibular joint: Secondary | ICD-10-CM | POA: Diagnosis not present

## 2018-01-29 NOTE — Patient Instructions (Addendum)
Increase naproxen to twice daily for the next 7-10 days. Avoid foods that are difficult to chew, as well as avoiding gum or chewing ice until symptoms are improved.    Temporomandibular Joint Syndrome  Temporomandibular joint syndrome (TMJ syndrome) is a condition that causes pain in the temporomandibular joints. These joints are located near your ears and allow your jaw to open and close. For people with TMJ syndrome, chewing, biting, or other movements of the jaw can be difficult or painful. TMJ syndrome is often mild and goes away within a few weeks. However, sometimes the condition becomes a long-term (chronic) problem. What are the causes? This condition may be caused by:  Grinding your teeth or clenching your jaw. Some people do this when they are under stress.  Arthritis.  Injury to the jaw.  Head or neck injury.  Teeth or dentures that are not aligned well. In some cases, the cause of TMJ syndrome may not be known. What are the signs or symptoms? The most common symptom of this condition is an aching pain on the side of the head in the area of the TMJ. Other symptoms may include:  Pain when moving your jaw, such as when chewing or biting.  Being unable to open your jaw all the way.  Making a clicking sound when you open your mouth.  Headache.  Earache.  Neck or shoulder pain. How is this diagnosed? This condition may be diagnosed based on:  Your symptoms and medical history.  A physical exam. Your health care provider may check the range of motion of your jaw.  Imaging tests, such as X-rays or an MRI. You may also need to see your dentist, who will determine if your teeth and jaw are lined up correctly. How is this treated? TMJ syndrome often goes away on its own. If treatment is needed, the options may include:  Eating soft foods and applying ice or heat.  Medicines to relieve pain or inflammation.  Medicines or massage to relax the muscles.  A splint,  bite plate, or mouthpiece to prevent teeth grinding or jaw clenching.  Relaxation techniques or counseling to help reduce stress.  A therapy for pain in which an electrical current is applied to the nerves through the skin (transcutaneous electrical nerve stimulation).  Acupuncture. This is sometimes helpful to relieve pain.  Jaw surgery. This is rarely needed. Follow these instructions at home:  Eating and drinking  Eat a soft diet if you are having trouble chewing.  Avoid foods that require a lot of chewing. Do not chew gum. General instructions  Take over-the-counter and prescription medicines only as told by your health care provider.  If directed, put ice on the painful area. ? Put ice in a plastic bag. ? Place a towel between your skin and the bag. ? Leave the ice on for 20 minutes, 2-3 times a day.  Apply a warm, wet cloth (warm compress) to the painful area as directed.  Massage your jaw area and do any jaw stretching exercises as told by your health care provider.  If you were given a splint, bite plate, or mouthpiece, wear it as told by your health care provider.  Keep all follow-up visits as told by your health care provider. This is important. Contact a health care provider if:  You are having trouble eating.  You have new or worsening symptoms. Get help right away if:  Your jaw locks open or closed. Summary  Temporomandibular joint syndrome (TMJ syndrome)  is a condition that causes pain in the temporomandibular joints. These joints are located near your ears and allow your jaw to open and close.  TMJ syndrome is often mild and goes away within a few weeks. However, sometimes the condition becomes a long-term (chronic) problem.  Symptoms include an aching pain on the side of the head in the area of the TMJ, pain when chewing or biting, and being unable to open your jaw all the way. You may also make a clicking sound when you open your mouth.  TMJ syndrome  often goes away on its own. If treatment is needed, it may include medicines to relieve pain, reduce inflammation, or relax the muscles. A splint, bite plate, or mouthpiece may also be used to prevent teeth grinding or jaw clenching. This information is not intended to replace advice given to you by your health care provider. Make sure you discuss any questions you have with your health care provider. Document Released: 10/09/2000 Document Revised: 02/25/2017 Document Reviewed: 02/25/2017 Elsevier Interactive Patient Education  2019 [employer]

## 2018-01-29 NOTE — Progress Notes (Signed)
   Subjective:    Patient ID: Katherine Hoffman, female    DOB: [birth year], 78 y.o.   MRN: [id number]  Otalgia   There is pain in both ears. This is a recurrent (pt has had this before; thinks it is due to hearing aids) problem. Associated symptoms include headaches. Pertinent negatives include no coughing, rhinorrhea or sore throat. Associated symptoms comments: Pt states her right ear is mostly affected and is causing pain on right side of head. Pt had wax build up in left ear last year and had to go to Urgent Care to have wax removed. . She has tried nothing for the symptoms.   Reports ear pain x 1 week, pain to right jaw and temple area as well. Denies fever. Denies cough or congestion. Reports regularly chewing gum and ice.    Review of Systems  Constitutional: Negative for fever.  HENT: Positive for ear pain. Negative for congestion, rhinorrhea and sore throat.   Respiratory: Negative for cough.   Neurological: Positive for headaches.       Objective:   Physical Exam Vitals signs and nursing note reviewed.  Constitutional:      General: She is not in acute distress.    Appearance: She is well-developed.  HENT:     Head: Normocephalic and atraumatic.     Jaw: Tenderness (to right TMJ, no crepitus) and pain on movement present. No swelling.     Right Ear: Tympanic membrane and ear canal normal. There is no impacted cerumen.     Left Ear: Tympanic membrane and ear canal normal. There is no impacted cerumen.     Nose: Nose normal.     Mouth/Throat:     Mouth: Mucous membranes are moist.     Pharynx: Oropharynx is clear.  Eyes:     General:        Right eye: No discharge.        Left eye: No discharge.  Neck:     Musculoskeletal: Neck supple.  Cardiovascular:     Rate and Rhythm: Normal rate and regular rhythm.     Heart sounds: Normal heart sounds. No murmur.  Pulmonary:     Effort: Pulmonary effort is normal. No respiratory distress.     Breath sounds: Normal breath  sounds.  Lymphadenopathy:     Cervical: No cervical adenopathy.  Skin:    General: Skin is warm and dry.  Neurological:     Mental Status: She is alert and oriented to person, place, and time.           Assessment & Plan:  TMJ inflammation  Discussed potential for trigeminal nerve to be causing referred pain to ear, however on exam ears are normal. Tenderness to TMJ, will treat with increasing naproxen to bid dosing for the next 7-10 days, avoiding difficult to chew foods. F/u if symptoms worsen or fail to improve.   Dr. Lubertha South was consulted on this case and is in agreement with the above treatment plan.

## 2018-03-03 ENCOUNTER — Telehealth: Payer: Self-pay | Admitting: Family Medicine

## 2018-03-03 NOTE — Telephone Encounter (Signed)
Left message to return call to get more information 

## 2018-03-03 NOTE — Telephone Encounter (Signed)
Patient requesting a medication called in for pink eye, patient had to pick up grandchild from school and patient states they have pink eye, and due to being exposed pt would like a medication. Advise.  Pharmacy:  [id number] - [place], [place] - [address] AT [facility]

## 2018-03-03 NOTE — Telephone Encounter (Signed)
Patient states she has no symptoms of pink eye she picked up her grandchild from school and he had pink eye and she wanted to know if she needed to start drops to keep from getting it. Patient advised that we do no recommend the drops without symptoms and to call us back if she develops symptoms or has any problems.

## 2018-03-03 NOTE — Telephone Encounter (Signed)
correct 

## 2018-04-10 ENCOUNTER — Other Ambulatory Visit: Payer: Self-pay | Admitting: Family Medicine

## 2018-04-10 ENCOUNTER — Telehealth: Payer: Self-pay | Admitting: Family Medicine

## 2018-04-10 DIAGNOSIS — E1169 Type 2 diabetes mellitus with other specified complication: Secondary | ICD-10-CM

## 2018-04-10 DIAGNOSIS — Z79899 Other long term (current) drug therapy: Secondary | ICD-10-CM

## 2018-04-10 DIAGNOSIS — E119 Type 2 diabetes mellitus without complications: Secondary | ICD-10-CM

## 2018-04-10 DIAGNOSIS — Z0000 Encounter for general adult medical examination without abnormal findings: Secondary | ICD-10-CM

## 2018-04-10 DIAGNOSIS — E785 Hyperlipidemia, unspecified: Secondary | ICD-10-CM

## 2018-04-10 NOTE — Telephone Encounter (Signed)
Patient has wellness 06/17/18, requesting blood work.

## 2018-04-10 NOTE — Telephone Encounter (Signed)
Lab orders placed and pt is aware 

## 2018-04-10 NOTE — Telephone Encounter (Signed)
Rep same 

## 2018-04-10 NOTE — Telephone Encounter (Signed)
Last labs on 10/14/17 for hepatic, lipid, bmet, cbc, A1C. Please advise. Thank you

## 2018-04-13 ENCOUNTER — Other Ambulatory Visit: Payer: Self-pay | Admitting: Family Medicine

## 2018-04-13 NOTE — Telephone Encounter (Signed)
Six mo worth 

## 2018-04-17 ENCOUNTER — Ambulatory Visit: Payer: Medicare HMO | Admitting: Family Medicine

## 2018-04-21 ENCOUNTER — Ambulatory Visit: Payer: Medicare HMO | Admitting: Family Medicine

## 2018-05-03 ENCOUNTER — Other Ambulatory Visit: Payer: Self-pay | Admitting: Family Medicine

## 2018-05-10 ENCOUNTER — Other Ambulatory Visit: Payer: Self-pay | Admitting: Family Medicine

## 2018-05-11 ENCOUNTER — Ambulatory Visit (INDEPENDENT_AMBULATORY_CARE_PROVIDER_SITE_OTHER): Payer: Medicare HMO | Admitting: Family Medicine

## 2018-05-11 ENCOUNTER — Other Ambulatory Visit: Payer: Self-pay

## 2018-05-11 ENCOUNTER — Encounter: Payer: Self-pay | Admitting: Family Medicine

## 2018-05-11 DIAGNOSIS — E785 Hyperlipidemia, unspecified: Secondary | ICD-10-CM | POA: Diagnosis not present

## 2018-05-11 DIAGNOSIS — E1169 Type 2 diabetes mellitus with other specified complication: Secondary | ICD-10-CM

## 2018-05-11 DIAGNOSIS — E119 Type 2 diabetes mellitus without complications: Secondary | ICD-10-CM | POA: Diagnosis not present

## 2018-05-11 DIAGNOSIS — F5101 Primary insomnia: Secondary | ICD-10-CM

## 2018-05-11 DIAGNOSIS — R69 Illness, unspecified: Secondary | ICD-10-CM | POA: Diagnosis not present

## 2018-05-11 MED ORDER — ATORVASTATIN CALCIUM 20 MG PO TABS: 20.0000 mg | ORAL_TABLET | Freq: Every day | ORAL | 5 refills | Status: DC

## 2018-05-11 MED ORDER — ALPRAZOLAM 1 MG PO TABS: ORAL_TABLET | ORAL | 3 refills | Status: DC

## 2018-05-11 NOTE — Progress Notes (Signed)
   Subjective:    Patient ID: Katherine Hoffman, female    DOB: [birth year], 78 y.o.   MRN: [id number] Tele only Hyperlipidemia  This is a chronic problem. Treatments tried: lipitor. The current treatment provides no improvement of lipids. There are no compliance problems.  Risk factors for coronary artery disease include dyslipidemia and post-menopausal.    Virtual Visit via Video Note  I connected with Katherine Hoffman on 05/11/18 at  2:30 PM EDT by a video enabled telemedicine application and verified that I am speaking with the correct person using two identifiers.   I discussed the limitations of evaluation and management by telemedicine and the availability of in person appointments. The patient expressed understanding and agreed to proceed.  History of Present Illness:    Observations/Objective:   Assessment and Plan:   Follow Up Instructions:    I discussed the assessment and treatment plan with the patient. The patient was provided an opportunity to ask questions and all were answered. The patient agreed with the plan and demonstrated an understanding of the instructions.   The patient was advised to call back or seek an in-person evaluation if the symptoms worsen or if the condition fails to improve as anticipated.  I provided  minutes of non-face-to-face time during this encounter.   Patient continues to take lipid medication regularly. No obvious side effects from it. Generally does not miss a dose. Prior blood work results are reviewed with patient. Patient continues to work on fat intake in diet  Patient claims compliance with diabetes medication. No obvious side effects. Reports no substantial low sugar spells. Most numbers are generally in good range when checked fasting. Generally does not miss a dose of medication. Watching diabetic diet closely  wa;lking  Review of Systems     Objective:   Physical Exam        Assessment & Plan:  Impression diabetes  good control discussed maintain same meds  2.  Hyperlipidemia.  Good control discussed maintain same meds  3.  Anxiety regarding coronavirus.  Multiple questions answered.  Greater than 50% of this 25 minute face to face visit was spent in counseling and discussion and coordination of care regarding the above diagnosis/diagnosies

## 2018-05-11 NOTE — Telephone Encounter (Signed)
Virtual visit scheduled today with Dr Steve 

## 2018-06-17 ENCOUNTER — Encounter: Payer: Medicare HMO | Admitting: Family Medicine

## 2018-07-23 ENCOUNTER — Encounter: Payer: Self-pay | Admitting: Internal Medicine

## 2018-07-27 ENCOUNTER — Other Ambulatory Visit: Payer: Self-pay | Admitting: Family Medicine

## 2018-08-27 ENCOUNTER — Other Ambulatory Visit: Payer: Self-pay | Admitting: Family Medicine

## 2018-09-14 DIAGNOSIS — E119 Type 2 diabetes mellitus without complications: Secondary | ICD-10-CM | POA: Diagnosis not present

## 2018-09-14 DIAGNOSIS — E785 Hyperlipidemia, unspecified: Secondary | ICD-10-CM | POA: Diagnosis not present

## 2018-09-14 DIAGNOSIS — Z79899 Other long term (current) drug therapy: Secondary | ICD-10-CM | POA: Diagnosis not present

## 2018-09-14 DIAGNOSIS — E1169 Type 2 diabetes mellitus with other specified complication: Secondary | ICD-10-CM | POA: Diagnosis not present

## 2018-09-14 DIAGNOSIS — Z0000 Encounter for general adult medical examination without abnormal findings: Secondary | ICD-10-CM | POA: Diagnosis not present

## 2018-09-15 LAB — CBC WITH DIFFERENTIAL/PLATELET
Basophils Absolute: 0.1 10*3/uL (ref 0.0–0.2)
Basos: 1 %
EOS (ABSOLUTE): 0.3 10*3/uL (ref 0.0–0.4)
Eos: 3 %
Hematocrit: 49.8 % — ABNORMAL HIGH (ref 34.0–46.6)
Hemoglobin: 17.3 g/dL — ABNORMAL HIGH (ref 11.1–15.9)
Immature Grans (Abs): 0 10*3/uL (ref 0.0–0.1)
Immature Granulocytes: 0 %
Lymphocytes Absolute: 1.9 10*3/uL (ref 0.7–3.1)
Lymphs: 24 %
MCH: 33.2 pg — ABNORMAL HIGH (ref 26.6–33.0)
MCHC: 34.7 g/dL (ref 31.5–35.7)
MCV: 96 fL (ref 79–97)
Monocytes Absolute: 0.8 10*3/uL (ref 0.1–0.9)
Monocytes: 11 %
Neutrophils Absolute: 4.8 10*3/uL (ref 1.4–7.0)
Neutrophils: 61 %
Platelets: 261 10*3/uL (ref 150–450)
RBC: 5.21 x10E6/uL (ref 3.77–5.28)
RDW: 11.9 % (ref 11.7–15.4)
WBC: 7.8 10*3/uL (ref 3.4–10.8)

## 2018-09-15 LAB — HEPATIC FUNCTION PANEL
ALT: 19 IU/L (ref 0–32)
AST: 21 IU/L (ref 0–40)
Albumin: 4.9 g/dL — ABNORMAL HIGH (ref 3.7–4.7)
Alkaline Phosphatase: 92 IU/L (ref 39–117)
Bilirubin Total: 0.8 mg/dL (ref 0.0–1.2)
Bilirubin, Direct: 0.24 mg/dL (ref 0.00–0.40)
Total Protein: 7.3 g/dL (ref 6.0–8.5)

## 2018-09-15 LAB — BASIC METABOLIC PANEL
BUN/Creatinine Ratio: 13 (ref 12–28)
BUN: 11 mg/dL (ref 8–27)
CO2: 24 mmol/L (ref 20–29)
Calcium: 10.4 mg/dL — ABNORMAL HIGH (ref 8.7–10.3)
Chloride: 101 mmol/L (ref 96–106)
Creatinine, Ser: 0.87 mg/dL (ref 0.57–1.00)
GFR calc Af Amer: 74 mL/min/{1.73_m2} (ref >59)
GFR calc non Af Amer: 64 mL/min/{1.73_m2} (ref >59)
Glucose: 118 mg/dL — ABNORMAL HIGH (ref 65–99)
Potassium: 4.3 mmol/L (ref 3.5–5.2)
Sodium: 142 mmol/L (ref 134–144)

## 2018-09-15 LAB — LIPID PANEL
Chol/HDL Ratio: 2.8 ratio (ref 0.0–4.4)
Cholesterol, Total: 136 mg/dL (ref 100–199)
HDL: 48 mg/dL (ref >39)
LDL Calculated: 60 mg/dL (ref 0–99)
Triglycerides: 141 mg/dL (ref 0–149)
VLDL Cholesterol Cal: 28 mg/dL (ref 5–40)

## 2018-09-15 LAB — HEMOGLOBIN A1C
Est. average glucose Bld gHb Est-mCnc: 111 mg/dL
Hgb A1c MFr Bld: 5.5 % (ref 4.8–5.6)

## 2018-09-18 ENCOUNTER — Other Ambulatory Visit: Payer: Self-pay | Admitting: Family Medicine

## 2018-09-21 ENCOUNTER — Encounter: Payer: Self-pay | Admitting: Family Medicine

## 2018-09-21 ENCOUNTER — Ambulatory Visit (INDEPENDENT_AMBULATORY_CARE_PROVIDER_SITE_OTHER): Payer: Medicare HMO | Admitting: Family Medicine

## 2018-09-21 ENCOUNTER — Other Ambulatory Visit: Payer: Self-pay

## 2018-09-21 VITALS — BP 138/90 | Temp 96.5°F | Ht 61.0 in | Wt 105.4 lb

## 2018-09-21 DIAGNOSIS — E785 Hyperlipidemia, unspecified: Secondary | ICD-10-CM | POA: Diagnosis not present

## 2018-09-21 DIAGNOSIS — E1169 Type 2 diabetes mellitus with other specified complication: Secondary | ICD-10-CM

## 2018-09-21 DIAGNOSIS — F5101 Primary insomnia: Secondary | ICD-10-CM

## 2018-09-21 DIAGNOSIS — Z0000 Encounter for general adult medical examination without abnormal findings: Secondary | ICD-10-CM

## 2018-09-21 DIAGNOSIS — E119 Type 2 diabetes mellitus without complications: Secondary | ICD-10-CM

## 2018-09-21 DIAGNOSIS — R69 Illness, unspecified: Secondary | ICD-10-CM | POA: Diagnosis not present

## 2018-09-21 MED ORDER — GLUCOSE BLOOD VI STRP: ORAL_STRIP | 5 refills | Status: DC

## 2018-09-21 MED ORDER — ALPRAZOLAM 1 MG PO TABS: ORAL_TABLET | ORAL | 5 refills | Status: DC

## 2018-09-21 MED ORDER — OMEPRAZOLE 40 MG PO CPDR: DELAYED_RELEASE_CAPSULE | ORAL | 1 refills | Status: DC

## 2018-09-21 MED ORDER — ATORVASTATIN CALCIUM 20 MG PO TABS: 20.0000 mg | ORAL_TABLET | Freq: Every day | ORAL | 1 refills | Status: DC

## 2018-09-21 NOTE — Progress Notes (Signed)
Subjective:  Patient arrives office for wellness plus numerous chronic problems visit  Patient ID: Katherine PimpleChristine M Jane, female    DOB: 12/31/1940, 78 y.o.   MRN: [id number]  HPI AWV- Annual Wellness Visit  The patient was seen for their annual wellness visit. The patient's past medical history, surgical history, and family history were reviewed. Pertinent vaccines were reviewed ( tetanus, pneumonia, shingles, flu) The patient's medication list was reviewed and updated.  The height and weight were entered.  BMI recorded in electronic record elsewhere  Cognitive screening was completed. Outcome of Mini - Cog: Pass   Falls /depression screening electronically recorded within record elsewhere  Current tobacco usage:none (All patients who use tobacco were given written and verbal information on quitting)  Recent listing of emergency department/hospitalizations over the past year were reviewed.  current specialist the patient sees on a regular basis:  none   Medicare annual wellness visit patient questionnaire was reviewed.  A written screening schedule for the patient for the next 5-10 years was given. Appropriate discussion of followup regarding next visit was discussed.  Results for orders placed or performed in visit on 04/10/18  Hemoglobin A1c  Result Value Ref Range   Hgb A1c MFr Bld 5.5 4.8 - 5.6 %   Est. average glucose Bld gHb Est-mCnc 111 mg/dL  CBC with Differential  Result Value Ref Range   WBC 7.8 3.4 - 10.8 x10E3/uL   RBC 5.21 3.77 - 5.28 x10E6/uL   Hemoglobin 17.3 (H) 11.1 - 15.9 g/dL   Hematocrit 40.949.8 (H) 81.134.0 - 46.6 %   MCV 96 79 - 97 fL   MCH 33.2 (H) 26.6 - 33.0 pg   MCHC 34.7 31.5 - 35.7 g/dL   RDW 91.411.9 78.211.7 - 95.615.4 %   Platelets 261 150 - 450 x10E3/uL   Neutrophils 61 Not Estab. %   Lymphs 24 Not Estab. %   Monocytes 11 Not Estab. %   Eos 3 Not Estab. %   Basos 1 Not Estab. %   Neutrophils Absolute 4.8 1.4 - 7.0 x10E3/uL   Lymphocytes Absolute 1.9  0.7 - 3.1 x10E3/uL   Monocytes Absolute 0.8 0.1 - 0.9 x10E3/uL   EOS (ABSOLUTE) 0.3 0.0 - 0.4 x10E3/uL   Basophils Absolute 0.1 0.0 - 0.2 x10E3/uL   Immature Granulocytes 0 Not Estab. %   Immature Grans (Abs) 0.0 0.0 - 0.1 x10E3/uL  Basic Metabolic Panel (BMET)  Result Value Ref Range   Glucose 118 (H) 65 - 99 mg/dL   BUN 11 8 - 27 mg/dL   Creatinine, Ser 2.130.87 0.57 - 1.00 mg/dL   GFR calc non Af Amer 64 >59 mL/min/1.73   GFR calc Af Amer 74 >59 mL/min/1.73   BUN/Creatinine Ratio 13 12 - 28   Sodium 142 134 - 144 mmol/L   Potassium 4.3 3.5 - 5.2 mmol/L   Chloride 101 96 - 106 mmol/L   CO2 24 20 - 29 mmol/L   Calcium 10.4 (H) 8.7 - 10.3 mg/dL  Lipid Profile  Result Value Ref Range   Cholesterol, Total 136 100 - 199 mg/dL   Triglycerides 086141 0 - 149 mg/dL   HDL 48 >57>39 mg/dL   VLDL Cholesterol Cal 28 5 - 40 mg/dL   LDL Calculated 60 0 - 99 mg/dL   Chol/HDL Ratio 2.8 0.0 - 4.4 ratio  Hepatic function panel  Result Value Ref Range   Total Protein 7.3 6.0 - 8.5 g/dL   Albumin 4.9 (H) 3.7 - 4.7 g/dL  Bilirubin Total 0.8 0.0 - 1.2 mg/dL   Bilirubin, Direct 2.950.24 0.00 - 0.40 mg/dL   Alkaline Phosphatase 92 39 - 117 IU/L   AST 21 0 - 40 IU/L   ALT 19 0 - 32 IU/L   Prediabetes Katherine Hoffman reports does not miss a dose.  Diet reviewed with her. Patient continues to take lipid medication regularly. No obvious side effects from it. Generally does not miss a dose. Prior blood work results are reviewed with patient. Patient continues to work on fat intake in diet   Katherine Hoffman reports without this medication she would be unable to sleep and it would be very challenging.  Uses it for insomnia and anxiety.  No excessive sedation Patient compliant with insomnia medication. Generally takes most nights. No obvious morning drowsiness. Definitely helps patient sleep. Without it patient states would not get a good nights rest.   Katherine Hoffman reports her sugars today.  Mostly between 101 110 watching  diet faithfully. Patient claims compliance with diabetes medication. No obvious side effects. Reports no substantial low sugar spells. Most numbers are generally in good range when checked fasting. Generally does not miss a dose of medication. Watching diabetic diet closely    Review of Systems  Constitutional: Negative for activity change, appetite change and fatigue.  HENT: Negative for congestion and rhinorrhea.   Eyes: Negative for discharge.  Respiratory: Negative for cough, chest tightness and wheezing.   Cardiovascular: Negative for chest pain.  Gastrointestinal: Negative for abdominal pain, blood in stool and vomiting.  Endocrine: Negative for polyphagia.  Genitourinary: Negative for difficulty urinating and frequency.  Musculoskeletal: Negative for neck pain.  Skin: Negative for color change.  Allergic/Immunologic: Negative for environmental allergies and food allergies.  Neurological: Negative for weakness and headaches.  Psychiatric/Behavioral: Negative for agitation and behavioral problems.  All other systems reviewed and are negative.      Objective:   Physical Exam Vitals signs reviewed.  Constitutional:      Appearance: She is well-developed.  HENT:     Head: Normocephalic.     Right Ear: External ear normal.     Left Ear: External ear normal.  Eyes:     Pupils: Pupils are equal, round, and reactive to light.  Neck:     Musculoskeletal: Normal range of motion.     Thyroid: No thyromegaly.  Cardiovascular:     Rate and Rhythm: Normal rate and regular rhythm.     Heart sounds: Normal heart sounds. No murmur.  Pulmonary:     Effort: Pulmonary effort is normal. No respiratory distress.     Breath sounds: Normal breath sounds. No wheezing.  Abdominal:     General: Bowel sounds are normal. There is no distension.     Palpations: Abdomen is soft. There is no mass.     Tenderness: There is no abdominal tenderness.  Musculoskeletal: Normal range of motion.         General: No tenderness.  Lymphadenopathy:     Cervical: No cervical adenopathy.  Skin:    General: Skin is warm and dry.  Neurological:     Mental Status: She is alert and oriented to person, place, and time.     Motor: No abnormal muscle tone.  Psychiatric:        Behavior: Behavior normal.           Assessment & Plan:  Impression 1 wellness exam.  Up-to-date on colonoscopy.  Diet discussed.  Exercise discussed.  Afraid to get mammo this yr  2.  Type 2 diabetes.  A1c also discussed to maintain same approach congratulated  3.  Hyperlipidemia.  LDL at goal discussed compliance discussed medication refill  4.  Insomnia with element of anxiety.  Ongoing need for meds discussed maintains  Follow-up in 6 months

## 2018-09-25 ENCOUNTER — Other Ambulatory Visit: Payer: Self-pay | Admitting: Family Medicine

## 2018-09-28 DIAGNOSIS — R69 Illness, unspecified: Secondary | ICD-10-CM | POA: Diagnosis not present

## 2018-12-09 ENCOUNTER — Other Ambulatory Visit: Payer: Self-pay | Admitting: Family Medicine

## 2018-12-09 NOTE — Telephone Encounter (Signed)
Six mo worth 

## 2019-01-29 ENCOUNTER — Other Ambulatory Visit: Payer: Self-pay | Admitting: Family Medicine

## 2019-02-09 ENCOUNTER — Other Ambulatory Visit: Payer: Self-pay | Admitting: Family Medicine

## 2019-02-25 ENCOUNTER — Telehealth: Payer: Self-pay | Admitting: Family Medicine

## 2019-02-25 NOTE — Telephone Encounter (Signed)
no

## 2019-02-25 NOTE — Telephone Encounter (Signed)
See message below and last labs were 09/14/18 lipid, liver, bmp, cbc, a1c

## 2019-02-25 NOTE — Telephone Encounter (Signed)
Patient wants to know if she is suppose to have lab work before her phone visit on 03/24/19.  She said her insurance will only pay for her to have it once a year and she usually has it done in the fall.  Last physical was 09/21/18 and her last labs were done 09/14/18.

## 2019-02-26 NOTE — Telephone Encounter (Signed)
Pt contacted and verbalized understanding.  

## 2019-03-04 ENCOUNTER — Encounter: Payer: Self-pay | Admitting: Family Medicine

## 2019-03-24 ENCOUNTER — Ambulatory Visit (INDEPENDENT_AMBULATORY_CARE_PROVIDER_SITE_OTHER): Payer: Medicare HMO | Admitting: Family Medicine

## 2019-03-24 ENCOUNTER — Other Ambulatory Visit: Payer: Self-pay

## 2019-03-24 DIAGNOSIS — E1169 Type 2 diabetes mellitus with other specified complication: Secondary | ICD-10-CM | POA: Diagnosis not present

## 2019-03-24 DIAGNOSIS — E785 Hyperlipidemia, unspecified: Secondary | ICD-10-CM | POA: Diagnosis not present

## 2019-03-24 DIAGNOSIS — E119 Type 2 diabetes mellitus without complications: Secondary | ICD-10-CM | POA: Diagnosis not present

## 2019-03-24 MED ORDER — ATORVASTATIN CALCIUM 20 MG PO TABS: 20.0000 mg | ORAL_TABLET | Freq: Every day | ORAL | 1 refills | Status: DC

## 2019-03-24 MED ORDER — OMEPRAZOLE 40 MG PO CPDR: 40.0000 mg | DELAYED_RELEASE_CAPSULE | Freq: Every day | ORAL | 1 refills | Status: DC

## 2019-03-24 NOTE — Progress Notes (Signed)
   Subjective:    Patient ID: Katherine Hoffman, female    DOB: Mar 17, 1940, 79 y.o.   MRN: [id number]  Diabetes She presents for her follow-up diabetic visit. She has type 2 diabetes mellitus. Risk factors for coronary artery disease include diabetes mellitus, dyslipidemia and post-menopausal. Current diabetic treatment includes diet. She is compliant with treatment all of the time. Her weight is stable. She is following a diabetic diet.   Sugars running great 105-113 Patient wonders why she is cold all the time- others in shorts and she is cold.  Virtual Visit via Video Note  I connected with Katherine Hoffman on 03/24/19 at  8:30 AM EST by a video enabled telemedicine application and verified that I am speaking with the correct person using two identifiers.  Location: Patient: home Provider: office   I discussed the limitations of evaluation and management by telemedicine and the availability of in person appointments. The patient expressed understanding and agreed to proceed.  History of Present Illness:    Observations/Objective:   Assessment and Plan:   Follow Up Instructions:    I discussed the assessment and treatment plan with the patient. The patient was provided an opportunity to ask questions and all were answered. The patient agreed with the plan and demonstrated an understanding of the instructions.   The patient was advised to call back or seek an in-person evaluation if the symptoms worsen or if the condition fails to improve as anticipated.  I provided 23 minutes of non-face-to-face time during this encounter.  Patient claims compliance with diabetes medication. No obvious side effects. Reports no substantial low sugar spells. Most numbers are generally in good range when checked fasting. Generally does not miss a dose of medication. Watching diabetic diet closely  Patient continues to take lipid medication regularly. No obvious side effects from it. Generally  does not miss a dose. Prior blood work results are reviewed with patient. Patient continues to work on fat intake in diet     Review of Systems No headache no chest pain or shortness of breath    Objective:   Physical Exam  Virtual      Assessment & Plan:  Impression type 2 diabetes.  Good control discussed to maintain same approach diet discussed  2.  Hyperlipidemia.  6 months ago blood work was excellent we will hold off at this time.  Medications refilled diet exercise discussed follow-up in 6 months

## 2019-04-20 DIAGNOSIS — R69 Illness, unspecified: Secondary | ICD-10-CM | POA: Diagnosis not present

## 2019-05-20 ENCOUNTER — Telehealth: Payer: Self-pay | Admitting: Family Medicine

## 2019-05-20 NOTE — Telephone Encounter (Signed)
Please advise. Thank you

## 2019-05-20 NOTE — Telephone Encounter (Signed)
Pt needs Dr. Brett Canales to write a letter for her to complete her will. She needs it stating she is mentally and physically well and competent to complete the will and handle her affairs.

## 2019-05-30 NOTE — Telephone Encounter (Signed)
Nurses, one of you plz reach out to this patient and ask her why she is specifically requesting this? Has someone expressed a concern about her in this regard?

## 2019-05-31 NOTE — Telephone Encounter (Signed)
Pt states she had her will made up and left her property to her grandson but her daughter wasn't happy about it. Daughter has made several comments regarding this and got a Clinical research associate. Patient stated her lawyer suggested getting a letter from PCP stating she was physically and mentally capable of making this decision just in case. Patient has another meeting with her lawyer Wednesday and would like to pick up letter.

## 2019-06-01 ENCOUNTER — Encounter: Payer: Self-pay | Admitting: *Deleted

## 2019-06-01 NOTE — Telephone Encounter (Signed)
done

## 2019-06-01 NOTE — Telephone Encounter (Signed)
Did you create letter in epic?

## 2019-06-01 NOTE — Progress Notes (Signed)
Error

## 2019-06-02 NOTE — Telephone Encounter (Signed)
Pt picked up letter.

## 2019-06-03 DIAGNOSIS — G4700 Insomnia, unspecified: Secondary | ICD-10-CM | POA: Diagnosis not present

## 2019-06-03 DIAGNOSIS — Z7722 Contact with and (suspected) exposure to environmental tobacco smoke (acute) (chronic): Secondary | ICD-10-CM | POA: Diagnosis not present

## 2019-06-03 DIAGNOSIS — R69 Illness, unspecified: Secondary | ICD-10-CM | POA: Diagnosis not present

## 2019-06-03 DIAGNOSIS — K219 Gastro-esophageal reflux disease without esophagitis: Secondary | ICD-10-CM | POA: Diagnosis not present

## 2019-06-03 DIAGNOSIS — E785 Hyperlipidemia, unspecified: Secondary | ICD-10-CM | POA: Diagnosis not present

## 2019-06-03 DIAGNOSIS — G8929 Other chronic pain: Secondary | ICD-10-CM | POA: Diagnosis not present

## 2019-06-03 DIAGNOSIS — R030 Elevated blood-pressure reading, without diagnosis of hypertension: Secondary | ICD-10-CM | POA: Diagnosis not present

## 2019-06-03 DIAGNOSIS — M1990 Unspecified osteoarthritis, unspecified site: Secondary | ICD-10-CM | POA: Diagnosis not present

## 2019-06-03 DIAGNOSIS — Z791 Long term (current) use of non-steroidal anti-inflammatories (NSAID): Secondary | ICD-10-CM | POA: Diagnosis not present

## 2019-06-22 ENCOUNTER — Telehealth: Payer: Self-pay | Admitting: Family Medicine

## 2019-06-22 DIAGNOSIS — E785 Hyperlipidemia, unspecified: Secondary | ICD-10-CM

## 2019-06-22 DIAGNOSIS — Z0000 Encounter for general adult medical examination without abnormal findings: Secondary | ICD-10-CM

## 2019-06-22 DIAGNOSIS — E1169 Type 2 diabetes mellitus with other specified complication: Secondary | ICD-10-CM

## 2019-06-22 DIAGNOSIS — E119 Type 2 diabetes mellitus without complications: Secondary | ICD-10-CM

## 2019-06-22 DIAGNOSIS — Z79899 Other long term (current) drug therapy: Secondary | ICD-10-CM

## 2019-06-22 NOTE — Telephone Encounter (Signed)
Pt has CPE scheduled 8/25 and would like to have lab work done a few days before her appt.

## 2019-06-22 NOTE — Telephone Encounter (Signed)
Last labs 09/14/18 lipid, liver, bmp, cbc, a1c

## 2019-06-22 NOTE — Telephone Encounter (Signed)
Ok those are fine. Thx. Dr. Ladona Ridgel

## 2019-06-22 NOTE — Telephone Encounter (Signed)
Orders put in and mailed to pt with a note on it to do about one week before appt on 8/25

## 2019-07-22 ENCOUNTER — Other Ambulatory Visit: Payer: Self-pay | Admitting: Family Medicine

## 2019-08-02 ENCOUNTER — Other Ambulatory Visit: Payer: Self-pay | Admitting: Family Medicine

## 2019-08-03 NOTE — Telephone Encounter (Signed)
Pt has CPE on 8/25

## 2019-09-15 DIAGNOSIS — Z0000 Encounter for general adult medical examination without abnormal findings: Secondary | ICD-10-CM | POA: Diagnosis not present

## 2019-09-15 DIAGNOSIS — E785 Hyperlipidemia, unspecified: Secondary | ICD-10-CM | POA: Diagnosis not present

## 2019-09-15 DIAGNOSIS — E119 Type 2 diabetes mellitus without complications: Secondary | ICD-10-CM | POA: Diagnosis not present

## 2019-09-15 DIAGNOSIS — Z79899 Other long term (current) drug therapy: Secondary | ICD-10-CM | POA: Diagnosis not present

## 2019-09-15 DIAGNOSIS — E1169 Type 2 diabetes mellitus with other specified complication: Secondary | ICD-10-CM | POA: Diagnosis not present

## 2019-09-16 LAB — LIPID PANEL
Chol/HDL Ratio: 2.9 ratio (ref 0.0–4.4)
Cholesterol, Total: 145 mg/dL (ref 100–199)
HDL: 50 mg/dL (ref >39)
LDL Chol Calc (NIH): 73 mg/dL (ref 0–99)
Triglycerides: 124 mg/dL (ref 0–149)
VLDL Cholesterol Cal: 22 mg/dL (ref 5–40)

## 2019-09-16 LAB — BASIC METABOLIC PANEL
BUN/Creatinine Ratio: 12 (ref 12–28)
BUN: 11 mg/dL (ref 8–27)
CO2: 26 mmol/L (ref 20–29)
Calcium: 10.3 mg/dL (ref 8.7–10.3)
Chloride: 103 mmol/L (ref 96–106)
Creatinine, Ser: 0.93 mg/dL (ref 0.57–1.00)
GFR calc Af Amer: 68 mL/min/{1.73_m2} (ref >59)
GFR calc non Af Amer: 59 mL/min/{1.73_m2} — ABNORMAL LOW (ref >59)
Glucose: 116 mg/dL — ABNORMAL HIGH (ref 65–99)
Potassium: 4.5 mmol/L (ref 3.5–5.2)
Sodium: 142 mmol/L (ref 134–144)

## 2019-09-16 LAB — CBC WITH DIFFERENTIAL/PLATELET
Basophils Absolute: 0.1 10*3/uL (ref 0.0–0.2)
Basos: 1 %
EOS (ABSOLUTE): 0.3 10*3/uL (ref 0.0–0.4)
Eos: 4 %
Hematocrit: 47 % — ABNORMAL HIGH (ref 34.0–46.6)
Hemoglobin: 15.9 g/dL (ref 11.1–15.9)
Immature Grans (Abs): 0 10*3/uL (ref 0.0–0.1)
Immature Granulocytes: 0 %
Lymphocytes Absolute: 2.2 10*3/uL (ref 0.7–3.1)
Lymphs: 28 %
MCH: 32.9 pg (ref 26.6–33.0)
MCHC: 33.8 g/dL (ref 31.5–35.7)
MCV: 97 fL (ref 79–97)
Monocytes Absolute: 0.9 10*3/uL (ref 0.1–0.9)
Monocytes: 12 %
Neutrophils Absolute: 4.2 10*3/uL (ref 1.4–7.0)
Neutrophils: 55 %
Platelets: 260 10*3/uL (ref 150–450)
RBC: 4.84 x10E6/uL (ref 3.77–5.28)
RDW: 11.8 % (ref 11.7–15.4)
WBC: 7.6 10*3/uL (ref 3.4–10.8)

## 2019-09-16 LAB — HEPATIC FUNCTION PANEL
ALT: 19 IU/L (ref 0–32)
AST: 28 IU/L (ref 0–40)
Albumin: 4.8 g/dL — ABNORMAL HIGH (ref 3.7–4.7)
Alkaline Phosphatase: 95 IU/L (ref 48–121)
Bilirubin Total: 0.9 mg/dL (ref 0.0–1.2)
Bilirubin, Direct: 0.25 mg/dL (ref 0.00–0.40)
Total Protein: 7.4 g/dL (ref 6.0–8.5)

## 2019-09-16 LAB — HEMOGLOBIN A1C
Est. average glucose Bld gHb Est-mCnc: 114 mg/dL
Hgb A1c MFr Bld: 5.6 % (ref 4.8–5.6)

## 2019-09-22 ENCOUNTER — Other Ambulatory Visit: Payer: Self-pay

## 2019-09-22 ENCOUNTER — Ambulatory Visit (INDEPENDENT_AMBULATORY_CARE_PROVIDER_SITE_OTHER): Payer: Medicare HMO | Admitting: Family Medicine

## 2019-09-22 VITALS — BP 136/86 | HR 76 | Temp 97.7°F | Ht 61.0 in | Wt 102.0 lb

## 2019-09-22 DIAGNOSIS — F5101 Primary insomnia: Secondary | ICD-10-CM

## 2019-09-22 DIAGNOSIS — E1169 Type 2 diabetes mellitus with other specified complication: Secondary | ICD-10-CM | POA: Diagnosis not present

## 2019-09-22 DIAGNOSIS — E119 Type 2 diabetes mellitus without complications: Secondary | ICD-10-CM | POA: Diagnosis not present

## 2019-09-22 DIAGNOSIS — R69 Illness, unspecified: Secondary | ICD-10-CM | POA: Diagnosis not present

## 2019-09-22 DIAGNOSIS — Z0000 Encounter for general adult medical examination without abnormal findings: Secondary | ICD-10-CM | POA: Diagnosis not present

## 2019-09-22 DIAGNOSIS — E785 Hyperlipidemia, unspecified: Secondary | ICD-10-CM | POA: Diagnosis not present

## 2019-09-22 MED ORDER — ATORVASTATIN CALCIUM 20 MG PO TABS: 20.0000 mg | ORAL_TABLET | Freq: Every day | ORAL | 1 refills | Status: DC

## 2019-09-22 MED ORDER — TRAZODONE HCL 50 MG PO TABS: ORAL_TABLET | ORAL | 0 refills | Status: DC

## 2019-09-22 MED ORDER — ALPRAZOLAM 1 MG PO TABS
1.0000 mg | ORAL_TABLET | Freq: Every evening | ORAL | 0 refills | Status: DC | PRN
Start: 2019-09-22 — End: 2019-10-21

## 2019-09-22 MED ORDER — OMEPRAZOLE 40 MG PO CPDR: 40.0000 mg | DELAYED_RELEASE_CAPSULE | Freq: Every day | ORAL | 1 refills | Status: DC

## 2019-09-22 NOTE — Patient Instructions (Addendum)
Take 1-2 tab of trazodone.  Take 1/2 tablet of xanax at bedtime. Then slowly increase to 2 tablet of trazodone.

## 2019-09-22 NOTE — Progress Notes (Signed)
Patient ID: Katherine Hoffman, female    DOB: Sep 06, 1940, 79 y.o.   MRN: [id number]   Chief Complaint  Patient presents with  . Annual Exam   Subjective:    HPI  Pt seen for her Annual medicare wellness visit.  Pt seen for f/u on DM2, and diet controlled.  92-115 in AM for fasting bg, her record she brought in for the past month. a1c is at 5.6, and doing well.   For insomnia- Pt taking xanax, 1 tab 25m for sleep. Taking only getting 4 hrs of sleep. Then she's back awake.  Has been on this medication since early 80's. Started on it from Dr. WKerri Hoffman her old pcp.   [place]ried to stop it [place] had [place] symptoms. When she retired, then tried to wean off slower.  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  She is now retired.   Xanax- Filled on 08-03-19 Pt denies any falls. But does have some memory issues and at times doesn't remember going to bed.   She said it works really fast to put her to sleep.    The patient comes in today for a wellness visit. A review of their health history was completed.  A review of medications was also completed.  Any needed refills; yes  Eating habits: eats what she wants  Falls/  MVA accidents in past few months: none  Regular exercise: walks a mile a day- weather permitting  Specialist pt sees on regular basis: none  Preventative health issues were discussed.   Additional concerns: Discuss recent labs                                     Medical History [name] a past medical history of Diverticulosis, GERD (gastroesophageal reflux disease), and Hemorrhoids.   Outpatient Encounter Medications as of 09/22/2019  Medication Sig  . naproxen (NAPROSYN) 500 MG tablet TAKE 1 TABLET BY MOUTH TWICE A DAY AS NEEDED.  .Marland Kitchenacetaminophen (TYLENOL) 650 MG CR tablet Take 650 mg by mouth every 8 (eight) hours as needed for pain.  .Marland KitchenALPRAZolam (XANAX) 1 MG tablet Take 1 tablet  (1 mg total) by mouth at bedtime as needed.  .Marland KitchenApple Cid Vn-Grn Tea-Bit Or-Cr (APPLE CIDER VINEGAR PLUS PO) Take by mouth.  .Marland Kitchenaspirin EC 81 MG tablet Take 81 mg by mouth daily.  .Marland Kitchenatorvastatin (LIPITOR) 20 MG tablet Take 1 tablet (20 mg total) by mouth daily.  . blood glucose meter kit and supplies KIT Dispense based on patient and insurance preference. Use to test glucose once daily. ICD - 10 code E11.9  . CINNAMON PO Take 1,000 mg by mouth daily.  . Cranberry (SM CRANBERRY) 300 MG tablet Take 300 mg by mouth daily.  . Multiple Vitamin (MULTIVITAMIN) capsule Take 1 capsule by mouth daily.  . Omega-3 Fatty Acids (FISH OIL) 1000 MG CAPS Take 1 capsule by mouth daily.  .Marland Kitchenomeprazole (PRILOSEC) 40 MG capsule Take 1 capsule (40 mg total) by mouth daily.  .Glory RosebushVERIO test strip TEST GLUCOSE ONCE DAILY  . traZODone (DESYREL) 50 MG tablet Take 1-2 tab qhs for insomnia.  . [DISCONTINUED] ALPRAZolam (XANAX) 1 MG tablet TAKE 1 TABLET BY MOUTH TWICE A DAY AS NEEDED  . [DISCONTINUED] atorvastatin (LIPITOR) 20 MG tablet TAKE 1 TABLET BY MOUTH  EVERY DAY  . [DISCONTINUED] diclofenac sodium (VOLTAREN) 1 % GEL APPLY 4 GRAMS 2 TIMES DAILY TO THE AFFECTED AREA.  . [DISCONTINUED] ketoconazole (NIZORAL) 2 % cream Apply 1 application topically 2 (two) times daily.  . [DISCONTINUED] omeprazole (PRILOSEC) 40 MG capsule Take 1 capsule (40 mg total) by mouth daily.   No facility-administered encounter medications on file as of 09/22/2019.     Review of Systems  Constitutional: Negative for chills and fever.  HENT: Negative for congestion, rhinorrhea and sore throat.   Respiratory: Negative for cough, shortness of breath and wheezing.   Cardiovascular: Negative for chest pain and leg swelling.  Gastrointestinal: Positive for constipation. Negative for abdominal pain, diarrhea, nausea and vomiting.  Endocrine: Positive for cold intolerance.  Genitourinary: Negative for dysuria and frequency.  Musculoskeletal:  Negative for arthralgias and back pain.  Skin: Negative for rash.  Neurological: Negative for dizziness, weakness and headaches.  Psychiatric/Behavioral: Positive for sleep disturbance. Negative for behavioral problems, confusion and dysphoric mood. The patient is not nervous/anxious.      Vitals BP 136/86   Pulse 76   Temp 97.7 F (36.5 C) (Oral)   Ht '5\' 1"'  (1.549 m)   Wt 102 lb (46.3 kg)   SpO2 98%   BMI 19.27 kg/m   Objective:   Physical Exam Vitals and nursing note reviewed.  Constitutional:      Appearance: Normal appearance.  HENT:     Head: Normocephalic and atraumatic.     Nose: Nose normal.     Mouth/Throat:     Mouth: Mucous membranes are moist.     Pharynx: Oropharynx is clear.  Eyes:     Extraocular Movements: Extraocular movements intact.     Conjunctiva/sclera: Conjunctivae normal.     Pupils: Pupils are equal, round, and reactive to light.  Cardiovascular:     Rate and Rhythm: Normal rate and regular rhythm.     Pulses: Normal pulses.     Heart sounds: Normal heart sounds.  Pulmonary:     Effort: Pulmonary effort is normal.     Breath sounds: Normal breath sounds. No wheezing, rhonchi or rales.  Musculoskeletal:        General: Normal range of motion.     Right lower leg: No edema.     Left lower leg: No edema.  Skin:    General: Skin is warm and dry.     Findings: No lesion or rash.  Neurological:     General: No focal deficit present.     Mental Status: She is alert and oriented to person, place, and time.  Psychiatric:        Mood and Affect: Mood normal.        Behavior: Behavior normal.        Thought Content: Thought content normal.        Judgment: Judgment normal.      Assessment and Plan   1. Medicare annual wellness visit, subsequent  2. Type 2 diabetes mellitus without complication, without long-term current use of insulin ([place])  3. Hyperlipidemia associated with type 2 diabetes mellitus ([place])  4. Primary insomnia   DM2-  stable, cont diet modification.  HLD-stable, cont meds.   Insomnia- pt has refill on file last script for xanax picked up on 08/03/19, for 60 tablets.  Pt stating only taking 1 tab at night for sleep.    I had a long discussion with the patient about the safety concerns of taking sedatives and anxiety medications over  long periods of time.  I don't usually prescribe these medications in patients over 8 yrs old.   It increases risk for memory concerns, early dementia and increased risk of falls.   In addition, there is newer guidelines about treating patients for insomnia and avoiding sedatives/anxiety meds (like Xanax, ativan, klonapin and ambien).    We discussed the need to start to taper off some of the xanax and try to start an alternative for sleep. The goal is to decrease or change these medications over time to something safer in elderly patients.  Pt willing to try trazodone for sleep and cut the xanax in half and work toward tapering off the xanax over the next couple months.  Seems on board for working toward tapering off the xanax.  A tapering plan was discussed to take 1/2 tab xanax at night and start trazodone 62m for 1 wk then increase to 1047mif needed 2nd week.    F/u 4wks on phone for recheck of insomnia and in 1 yr for her other medication refills. Labs reviewed, stable.

## 2019-09-26 ENCOUNTER — Encounter: Payer: Self-pay | Admitting: Family Medicine

## 2019-10-15 ENCOUNTER — Other Ambulatory Visit: Payer: Self-pay | Admitting: Family Medicine

## 2019-10-15 NOTE — Telephone Encounter (Signed)
We will be discussing this on 9/23 appt.  Thx. Dr. Ladona Ridgel

## 2019-10-15 NOTE — Telephone Encounter (Signed)
Seen 09/22/19 

## 2019-10-18 ENCOUNTER — Other Ambulatory Visit: Payer: Self-pay | Admitting: *Deleted

## 2019-10-18 MED ORDER — NAPROXEN 500 MG PO TABS
500.0000 mg | ORAL_TABLET | Freq: Two times a day (BID) | ORAL | 2 refills | Status: DC | PRN
Start: 2019-10-18 — End: 2020-01-12

## 2019-10-21 ENCOUNTER — Telehealth (INDEPENDENT_AMBULATORY_CARE_PROVIDER_SITE_OTHER): Payer: Medicare HMO | Admitting: Family Medicine

## 2019-10-21 ENCOUNTER — Other Ambulatory Visit: Payer: Self-pay | Admitting: Family Medicine

## 2019-10-21 ENCOUNTER — Other Ambulatory Visit: Payer: Self-pay

## 2019-10-21 DIAGNOSIS — F5101 Primary insomnia: Secondary | ICD-10-CM

## 2019-10-21 DIAGNOSIS — R69 Illness, unspecified: Secondary | ICD-10-CM | POA: Diagnosis not present

## 2019-10-21 DIAGNOSIS — K5900 Constipation, unspecified: Secondary | ICD-10-CM

## 2019-10-21 MED ORDER — TRAZODONE HCL 50 MG PO TABS
ORAL_TABLET | ORAL | 1 refills | Status: DC
Start: 2019-10-21 — End: 2019-12-29

## 2019-10-21 NOTE — Progress Notes (Signed)
Patient ID: Katherine Hoffman, female    DOB: 04-11-1940, 79 y.o.   MRN: [id number]   Chief Complaint  Patient presents with  . Insomnia   Subjective:  Virtual Visit via Telephone Note  I connected with Katherine Hoffman on 10/21/19 at 10:30 AM EDT by telephone and verified that I am speaking with the correct person using two identifiers.  Location: Patient: home Provider: office   I discussed the limitations, risks, security and privacy concerns of performing an evaluation and management service by telephone and the availability of in person appointments. I also discussed with the patient that there may be a patient responsible charge related to this service. The patient expressed understanding and agreed to proceed.   HPI Pt needing 4 week follow up regarding insomnia. Pt was placed on Trazodone 50 mg. Pt was taking 2 tablets but is now taking one tablet daily. Pt not doing much better on trazodone. Pt is down to taking 1/4 tablet daily of Xanax of 67m tabs  Has about 6 tabs left. Has about one week left on Xanax. Doing well with the taper and feeling better. Now taking the Trazodone- 578mtab.  Hurt back on lawn mower and then took 10028mrazodone for sleep but now down to 68m51mazodone.  At most only getting 4hrs of sleep, but this was common for her even when taking 1mg 93max.  She states since she was a child has had issues with sleeping. 3-3.5 hrs sleep if only taking 1 tab trazodone.  Wanting to be done and finish the few she has of xanax. Doesn't want to continue on it.   Added 2 tylenol pm at 8pm, trazodone 9pm, 10pm 1/4 xanax.  Then went to sleep.  Medical History ChrisFaridaa past medical history of Diverticulosis, GERD (gastroesophageal reflux disease), and Hemorrhoids.   Outpatient Encounter Medications as of 10/21/2019  Medication Sig  . acetaminophen (TYLENOL) 650 MG CR tablet Take 650 mg by mouth every 8 (eight) hours as needed for pain.  . AppMarland Kitchene Cid  Vn-Grn Tea-Bit Or-Cr (APPLE CIDER VINEGAR PLUS PO) Take by mouth.  . aspMarland Kitchenrin EC 81 MG tablet Take 81 mg by mouth daily.  . atoMarland Kitchenvastatin (LIPITOR) 20 MG tablet Take 1 tablet (20 mg total) by mouth daily.  . blood glucose meter kit and supplies KIT Dispense based on patient and insurance preference. Use to test glucose once daily. ICD - 10 code E11.9  . CINNAMON PO Take 1,000 mg by mouth daily.  . Cranberry (SM CRANBERRY) 300 MG tablet Take 300 mg by mouth daily.  . Multiple Vitamin (MULTIVITAMIN) capsule Take 1 capsule by mouth daily.  . naproxen (NAPROSYN) 500 MG tablet Take 1 tablet (500 mg total) by mouth 2 (two) times daily as needed.  . Omega-3 Fatty Acids (FISH OIL) 1000 MG CAPS Take 1 capsule by mouth daily.  . omeMarland Kitchenrazole (PRILOSEC) 40 MG capsule Take 1 capsule (40 mg total) by mouth daily.  . ONEGlory RosebushO test strip TEST GLUCOSE ONCE DAILY  . traZODone (DESYREL) 50 MG tablet Take 1-2 tab p.o. qhs for insomnia.  . [DISCONTINUED] ALPRAZolam (XANAX) 1 MG tablet Take 1 tablet (1 mg total) by mouth at bedtime as needed.  . [DISCONTINUED] traZODone (DESYREL) 50 MG tablet Take 1-2 tab qhs for insomnia.   No facility-administered encounter medications on file as of 10/21/2019.     Review of Systems  Constitutional: Negative for chills and fever.  HENT: Negative for congestion, rhinorrhea  and sore throat.   Respiratory: Negative for cough, shortness of breath and wheezing.   Cardiovascular: Negative for chest pain and leg swelling.  Gastrointestinal: Positive for constipation. Negative for abdominal pain, diarrhea, nausea and vomiting.  Genitourinary: Negative for dysuria and frequency.  Musculoskeletal: Negative for arthralgias and back pain.  Skin: Negative for rash.  Neurological: Negative for dizziness, weakness and headaches.  Psychiatric/Behavioral: Positive for sleep disturbance.     Vitals There were no vitals taken for this visit.  Objective:   Physical Exam  No PE due  to phone visit.  Assessment and Plan   1. Primary insomnia  2. Constipation, unspecified constipation type   Insomnia- increase trazodone to 110m qhs.  Pt to continue with xanax taper 1/4 tab 7 days, then every other day then discontinue.  Pt to call back if having nervousness or any other side effects.  Discussed trying to avoid taking large amt of tylenol pm also.  Constipation, take 1/2-1 cap daily and increase fluids, cont with 1 tab natural laxative in evening.  If not having bm in next 2-3 days to call back.   Follow Up Instructions:    I discussed the assessment and treatment plan with the patient. The patient was provided an opportunity to ask questions and all were answered. The patient agreed with the plan and demonstrated an understanding of the instructions.   The patient was advised to call back or seek an in-person evaluation if the symptoms worsen or if the condition fails to improve as anticipated.  I provided 12 minutes of non-face-to-face time during this encounter.

## 2019-11-03 ENCOUNTER — Encounter: Payer: Self-pay | Admitting: Family Medicine

## 2019-11-08 ENCOUNTER — Ambulatory Visit (INDEPENDENT_AMBULATORY_CARE_PROVIDER_SITE_OTHER): Payer: Medicare HMO | Admitting: Family Medicine

## 2019-11-08 ENCOUNTER — Encounter: Payer: Self-pay | Admitting: Family Medicine

## 2019-11-08 VITALS — BP 138/86 | HR 77 | Temp 97.2°F | Ht 61.0 in | Wt 100.4 lb

## 2019-11-08 DIAGNOSIS — R6889 Other general symptoms and signs: Secondary | ICD-10-CM

## 2019-11-08 DIAGNOSIS — R7301 Impaired fasting glucose: Secondary | ICD-10-CM

## 2019-11-08 DIAGNOSIS — R634 Abnormal weight loss: Secondary | ICD-10-CM

## 2019-11-08 DIAGNOSIS — R208 Other disturbances of skin sensation: Secondary | ICD-10-CM

## 2019-11-08 DIAGNOSIS — R234 Changes in skin texture: Secondary | ICD-10-CM

## 2019-11-08 NOTE — Progress Notes (Signed)
Patient ID: Katherine Hoffman, female    DOB: [birth year], 79 y.o.   MRN: [id number]   Chief Complaint  Patient presents with  . feels dehydrated and skin is sore   Subjective:    HPI "Feeling cold all the time."  Pt stating feels she is dehydrated and "skin is sore." Skin is wrinkly on arms. And if she pinches the skin on arms and legs, it's "tenting." Noticed it this 3 days ago.  Started increasing her fluids. Pt stating having "Steady weight loss for past 12month."  Per chart, lost 5 lbs over past year.  Drinking fluids and pedialyte over past week.  And feeling her skin is "sore." No exposure to flu.  No fever, cough, sore throat. No pain or burning when urinating. Drinking more then having to go more. 8/20- 105 lbs and now at 100lbs today.  Good appetite. Eating 3 meals per day. Some times 2 x per day.  Has stopped the xanax over the last 1-2 months has been working to taper off.  And has been off it for past 3 wks, feels she is doing well.   Taking trazodone and waking up again at 2am. Taking 2 tab at night.  Never a smoker. Not seeing bloody or black stools.  Feeling her "skin hurting."  Has colonoscopy years ago, normal per pt.  hasn't had one in a while. Thinking last one at 79yrs old.  Medical History CBrachahas a past medical history of Diverticulosis, GERD (gastroesophageal reflux disease), and Hemorrhoids.   Outpatient Encounter Medications as of 11/08/2019  Medication Sig  . acetaminophen (TYLENOL) 650 MG CR tablet Take 650 mg by mouth every 8 (eight) hours as needed for pain.  .Marland KitchenApple Cid Vn-Grn Tea-Bit Or-Cr (APPLE CIDER VINEGAR PLUS PO) Take by mouth.  .Marland Kitchenaspirin EC 81 MG tablet Take 81 mg by mouth daily.  .Marland Kitchenatorvastatin (LIPITOR) 20 MG tablet Take 1 tablet (20 mg total) by mouth daily.  . blood glucose meter kit and supplies KIT Dispense based on patient and insurance preference. Use to test glucose once daily. ICD - 10 code E11.9  . CINNAMON PO  Take 1,000 mg by mouth daily.  . Cranberry (SM CRANBERRY) 300 MG tablet Take 300 mg by mouth daily.  . Multiple Vitamin (MULTIVITAMIN) capsule Take 1 capsule by mouth daily.  . naproxen (NAPROSYN) 500 MG tablet Take 1 tablet (500 mg total) by mouth 2 (two) times daily as needed.  . Omega-3 Fatty Acids (FISH OIL) 1000 MG CAPS Take 1 capsule by mouth daily.  .Marland Kitchenomeprazole (PRILOSEC) 40 MG capsule Take 1 capsule (40 mg total) by mouth daily.  .Glory RosebushVERIO test strip TEST GLUCOSE ONCE DAILY  . traZODone (DESYREL) 50 MG tablet Take 1-2 tab p.o. qhs for insomnia.   No facility-administered encounter medications on file as of 11/08/2019.     Review of Systems  Constitutional: Negative for chills and fever.  HENT: Negative for congestion, rhinorrhea and sore throat.   Respiratory: Negative for cough, shortness of breath and wheezing.   Cardiovascular: Negative for chest pain and leg swelling.  Gastrointestinal: Negative for abdominal pain, diarrhea, nausea and vomiting.  Genitourinary: Negative for dysuria and frequency.  Musculoskeletal: Negative for arthralgias and back pain.  Skin: Negative for rash.       +skin pain +skin tenting.  Neurological: Negative for dizziness, weakness and headaches.     Vitals BP 138/86   Pulse 77   Temp (!) 97.2 F (  36.2 C) (Oral)   Ht '5\' 1"'  (1.549 m)   Wt 100 lb 6.4 oz (45.5 kg)   SpO2 98%   BMI 18.97 kg/m   Objective:   Physical Exam Vitals and nursing note reviewed.  Constitutional:      General: She is not in acute distress.    Appearance: Normal appearance. She is not ill-appearing.  HENT:     Head: Normocephalic and atraumatic.     Nose: Nose normal.     Mouth/Throat:     Mouth: Mucous membranes are moist.  Cardiovascular:     Rate and Rhythm: Normal rate and regular rhythm.     Pulses: Normal pulses.     Heart sounds: Normal heart sounds.  Pulmonary:     Effort: Pulmonary effort is normal.     Breath sounds: Normal breath  sounds. No wheezing, rhonchi or rales.  Abdominal:     General: Abdomen is flat. Bowel sounds are normal. There is no distension.     Palpations: Abdomen is soft. There is no mass.     Tenderness: There is no abdominal tenderness. There is no guarding or rebound.     Hernia: No hernia is present.  Musculoskeletal:        General: Normal range of motion.     Right lower leg: No edema.     Left lower leg: No edema.  Skin:    General: Skin is warm and dry.     Findings: No bruising, erythema, lesion or rash.     Comments: +mild skin tenting on arms.  Wrinkling on arms and legs.  Neurological:     General: No focal deficit present.     Mental Status: She is alert and oriented to person, place, and time.     Cranial Nerves: No cranial nerve deficit.  Psychiatric:        Mood and Affect: Mood normal.        Behavior: Behavior normal.        Thought Content: Thought content normal.        Judgment: Judgment normal.      Assessment and Plan   1. Cold intolerance - CBC - CMP14+EGFR - TSH - Urine Culture  2. Impaired fasting glucose - CMP14+EGFR - TSH  3. Skin pain - Urine Culture  4. Weight loss, unintentional  5. Skin texture changes   Pt unsure if she wants to get labs done, not sure if insurance will cover it.  Advised pt to call insurance or ask at the lab.  Since pt concerned about cost of labs, advised if she only gets one lab would recommend getting the TSH lab.  UA-negative. Sent for urine culture.  Cont to hydrate.  If labs coming back normal would recommend f/u with GI for weight loss.  Skin pain/soreness- take tylenol prn.  F/u prn.

## 2019-11-09 ENCOUNTER — Telehealth: Payer: Self-pay | Admitting: Family Medicine

## 2019-11-09 LAB — CBC
Hematocrit: 45.6 % (ref 34.0–46.6)
Hemoglobin: 15.4 g/dL (ref 11.1–15.9)
MCH: 33.7 pg — ABNORMAL HIGH (ref 26.6–33.0)
MCHC: 33.8 g/dL (ref 31.5–35.7)
MCV: 100 fL — ABNORMAL HIGH (ref 79–97)
Platelets: 298 10*3/uL (ref 150–450)
RBC: 4.57 x10E6/uL (ref 3.77–5.28)
RDW: 12.3 % (ref 11.7–15.4)
WBC: 10.5 10*3/uL (ref 3.4–10.8)

## 2019-11-09 LAB — CMP14+EGFR
ALT: 22 IU/L (ref 0–32)
AST: 26 IU/L (ref 0–40)
Albumin/Globulin Ratio: 2 (ref 1.2–2.2)
Albumin: 4.7 g/dL (ref 3.7–4.7)
Alkaline Phosphatase: 88 IU/L (ref 44–121)
BUN/Creatinine Ratio: 10 — ABNORMAL LOW (ref 12–28)
BUN: 8 mg/dL (ref 8–27)
Bilirubin Total: 0.5 mg/dL (ref 0.0–1.2)
CO2: 27 mmol/L (ref 20–29)
Calcium: 10.2 mg/dL (ref 8.7–10.3)
Chloride: 99 mmol/L (ref 96–106)
Creatinine, Ser: 0.8 mg/dL (ref 0.57–1.00)
GFR calc Af Amer: 81 mL/min/{1.73_m2} (ref >59)
GFR calc non Af Amer: 70 mL/min/{1.73_m2} (ref >59)
Globulin, Total: 2.4 g/dL (ref 1.5–4.5)
Glucose: 109 mg/dL — ABNORMAL HIGH (ref 65–99)
Potassium: 4.2 mmol/L (ref 3.5–5.2)
Sodium: 140 mmol/L (ref 134–144)
Total Protein: 7.1 g/dL (ref 6.0–8.5)

## 2019-11-09 LAB — TSH: TSH: 2.64 u[IU]/mL (ref 0.450–4.500)

## 2019-11-09 NOTE — Telephone Encounter (Signed)
Pt contacted and verbalized understanding. Pt on schedule for phone visit 12/20/19 at 1pm. Pt requested phone visit. (See lab results)

## 2019-11-09 NOTE — Telephone Encounter (Signed)
Pt called in and states that she has been having leg/foot cramps. Pt was in yesterday but forgot to mention to provider. Pt states the cramps keep her up all night. Started when she began to be dehydrated. Pt wanting to know if anything can be called into [facility]. Please advise. Thank you

## 2019-11-09 NOTE — Telephone Encounter (Signed)
All labs normal.  With cramping and skin pain she was having advising to take tylenol 500mg  every 6hrs as needed.   If pain worsening in the leg/foot then return to office sooner.  Dr. 

## 2019-11-25 DIAGNOSIS — H52203 Unspecified astigmatism, bilateral: Secondary | ICD-10-CM | POA: Diagnosis not present

## 2019-11-25 DIAGNOSIS — H40051 Ocular hypertension, right eye: Secondary | ICD-10-CM | POA: Diagnosis not present

## 2019-12-18 ENCOUNTER — Other Ambulatory Visit: Payer: Self-pay | Admitting: Family Medicine

## 2019-12-20 ENCOUNTER — Telehealth: Payer: Self-pay

## 2019-12-20 ENCOUNTER — Other Ambulatory Visit: Payer: Self-pay

## 2019-12-20 ENCOUNTER — Telehealth: Payer: Medicare HMO | Admitting: Family Medicine

## 2019-12-20 DIAGNOSIS — R69 Illness, unspecified: Secondary | ICD-10-CM | POA: Diagnosis not present

## 2019-12-20 MED ORDER — ONETOUCH VERIO VI STRP
ORAL_STRIP | 2 refills | Status: DC
Start: 2019-12-20 — End: 2023-08-25

## 2019-12-20 NOTE — Telephone Encounter (Signed)
Patient is requesting refill on test strip for one touch to be called into [facility] she is completely out

## 2019-12-20 NOTE — Telephone Encounter (Signed)
Prescription sent electronically to pharmacy. Patient notified. 

## 2019-12-29 ENCOUNTER — Telehealth (INDEPENDENT_AMBULATORY_CARE_PROVIDER_SITE_OTHER): Payer: Medicare HMO | Admitting: Family Medicine

## 2019-12-29 ENCOUNTER — Other Ambulatory Visit: Payer: Self-pay

## 2019-12-29 DIAGNOSIS — E119 Type 2 diabetes mellitus without complications: Secondary | ICD-10-CM | POA: Diagnosis not present

## 2019-12-29 DIAGNOSIS — R69 Illness, unspecified: Secondary | ICD-10-CM | POA: Diagnosis not present

## 2019-12-29 DIAGNOSIS — E1169 Type 2 diabetes mellitus with other specified complication: Secondary | ICD-10-CM | POA: Diagnosis not present

## 2019-12-29 DIAGNOSIS — E785 Hyperlipidemia, unspecified: Secondary | ICD-10-CM | POA: Diagnosis not present

## 2019-12-29 DIAGNOSIS — F5101 Primary insomnia: Secondary | ICD-10-CM

## 2019-12-29 NOTE — Progress Notes (Signed)
Patient ID: Katherine Hoffman, female    DOB: 1940/12/05, 79 y.o.   MRN: [id number]    Virtual Visit via Video Note  I connected with Katherine Hoffman on 12/29/19 at 11:20 AM EST by a video enabled telemedicine application and verified that I am speaking with the correct person using two identifiers.  Location: Patient: home Provider: office   I discussed the limitations of evaluation and management by telemedicine and the availability of in person appointments. The patient expressed understanding and agreed to proceed.   Chief Complaint  Patient presents with  . Insomnia   Subjective:    HPI  Pt had phone visit to f/u on insomnia.  Insomnia- pt was given trazodone 19m for sleep.  We tapered pt off xanax 153mover last few months.  The xanax wasn't helping much for sleep and pt waking up after 4 hrs of sleep.  Reports having insomnia since a child. Pt started taking melatonin 202mose otc.  In the past note in 9/21 pt tried tylenol pm also. Advised pt to avoid this due to urinary retention in pt over 65 21s old and confusion. Getting 4-5 hrs of sleep now and feeling more rested.   Not taking trazodone now, pt didn't feeling it was helping. Drinking more fluids and feeling skin isn't as dry.  Got past and skin is better.   Medical History ChrFranshescas a past medical history of Diverticulosis, GERD (gastroesophageal reflux disease), and Hemorrhoids.   Outpatient Encounter Medications as of 12/29/2019  Medication Sig  . acetaminophen (TYLENOL) 650 MG CR tablet Take 650 mg by mouth every 8 (eight) hours as needed for pain.  . AMarland Kitchenple Cid Vn-Grn Tea-Bit Or-Cr (APPLE CIDER VINEGAR PLUS PO) Take by mouth.  . aMarland Kitchenpirin EC 81 MG tablet Take 81 mg by mouth daily.  . aMarland Kitchenorvastatin (LIPITOR) 20 MG tablet Take 1 tablet (20 mg total) by mouth daily.  . blood glucose meter kit and supplies KIT Dispense based on patient and insurance preference. Use to test glucose once daily. ICD - 10 code  E11.9  . CINNAMON PO Take 1,000 mg by mouth daily.  . Cranberry (SM CRANBERRY) 300 MG tablet Take 300 mg by mouth daily.  . gMarland Kitchenucose blood (ONETOUCH VERIO) test strip TEST GLUCOSE ONCE DAILY  . Multiple Vitamin (MULTIVITAMIN) capsule Take 1 capsule by mouth daily.  . naproxen (NAPROSYN) 500 MG tablet Take 1 tablet (500 mg total) by mouth 2 (two) times daily as needed.  . Omega-3 Fatty Acids (FISH OIL) 1000 MG CAPS Take 1 capsule by mouth daily.  . oMarland Kitcheneprazole (PRILOSEC) 40 MG capsule Take 1 capsule (40 mg total) by mouth daily.  . OGlory RosebushTRA test strip TESTING ONCE DAILY.  . [DISCONTINUED] traZODone (DESYREL) 50 MG tablet Take 1-2 tab p.o. qhs for insomnia.   No facility-administered encounter medications on file as of 12/29/2019.     Review of Systems  Constitutional: Negative for chills and fever.  HENT: Negative for congestion, rhinorrhea and sore throat.   Respiratory: Negative for cough, shortness of breath and wheezing.   Cardiovascular: Negative for chest pain and leg swelling.  Gastrointestinal: Negative for abdominal pain, diarrhea, nausea and vomiting.  Genitourinary: Negative for dysuria and frequency.  Musculoskeletal: Negative for arthralgias and back pain.  Skin: Negative for rash.  Neurological: Negative for dizziness, weakness and headaches.  Psychiatric/Behavioral: Positive for sleep disturbance (improved).     Vitals There were no vitals taken for this visit.  Objective:  Physical Exam  No PE due to phone visit.  Assessment and Plan   1. Primary insomnia  2. Type 2 diabetes mellitus without complication, without long-term current use of insulin (HCC) - CBC - CMP14+EGFR - Hemoglobin A1c  3. Hyperlipidemia associated with type 2 diabetes mellitus (HCC) - CBC - CMP14+EGFR - Lipid panel    Insomnia improving- taking otc melatonin, feeling this is working better than trazodone.  Worked on last few visits of tapering pt off of xanax.  Pt was  successful with this. Pt discontinued the trazodone, felt was ineffective.  HLD and DM2-  -ordered labs for next visit.  F/u 3 mo for dm/hld and labs and insomnia.   Follow Up Instructions:    I discussed the assessment and treatment plan with the patient. The patient was provided an opportunity to ask questions and all were answered. The patient agreed with the plan and demonstrated an understanding of the instructions.   The patient was advised to call back or seek an in-person evaluation if the symptoms worsen or if the condition fails to improve as anticipated.  I provided 12 minutes of non-face-to-face time during this encounter.

## 2020-01-02 ENCOUNTER — Encounter: Payer: Self-pay | Admitting: Family Medicine

## 2020-01-12 ENCOUNTER — Other Ambulatory Visit: Payer: Self-pay | Admitting: *Deleted

## 2020-01-12 MED ORDER — NAPROXEN 500 MG PO TABS
500.0000 mg | ORAL_TABLET | Freq: Two times a day (BID) | ORAL | 2 refills | Status: DC | PRN
Start: 2020-01-12 — End: 2020-02-18

## 2020-01-19 ENCOUNTER — Telehealth: Payer: Self-pay | Admitting: *Deleted

## 2020-01-19 NOTE — Telephone Encounter (Signed)
Pt can pick up stool sample kit as a drive up and then bring back a sample.  It might be c.difficle.   If she uses immodium does it help?  How many per day is she taking?  Any abdominal pain or fever?  Thanks,   Dr. Lovena Le

## 2020-01-19 NOTE — Telephone Encounter (Signed)
Ok thanks, Dr. Karie Schwalbe

## 2020-01-19 NOTE — Telephone Encounter (Signed)
Patient called in stating she has had diarrhea for 3 weeks now- she thought she ate some bad food but this has continued. She reports going several times in the morning and maybe twice throughout the day. No fever, no abdominal pain and no black stools. Patient feels fine, is staying well hydrated. She does not feel comfortable coming into the office right now but wants to know if Dr. Ladona Ridgel recommends anything whether it be over the counter or otherwise. Has been using Imodium.

## 2020-01-19 NOTE — Telephone Encounter (Signed)
Patient states she has not had a bm since calling us earlier. If anything changes between tonight or tomorrow morning she will come get the stool sample kit. She says the imodium does help and she was only taking it once every other day. No pain or fever.

## 2020-02-02 ENCOUNTER — Other Ambulatory Visit: Payer: Self-pay

## 2020-02-02 ENCOUNTER — Ambulatory Visit (INDEPENDENT_AMBULATORY_CARE_PROVIDER_SITE_OTHER): Payer: Medicare HMO | Admitting: Family Medicine

## 2020-02-02 ENCOUNTER — Encounter: Payer: Self-pay | Admitting: Family Medicine

## 2020-02-02 VITALS — BP 128/82 | HR 83 | Temp 97.4°F | Ht 61.0 in | Wt 103.2 lb

## 2020-02-02 DIAGNOSIS — H9193 Unspecified hearing loss, bilateral: Secondary | ICD-10-CM

## 2020-02-02 DIAGNOSIS — H6503 Acute serous otitis media, bilateral: Secondary | ICD-10-CM

## 2020-02-02 DIAGNOSIS — L219 Seborrheic dermatitis, unspecified: Secondary | ICD-10-CM

## 2020-02-02 MED ORDER — KETOCONAZOLE 2 % EX CREA: 1.0000 "application " | TOPICAL_CREAM | Freq: Every day | CUTANEOUS | 0 refills | Status: DC

## 2020-02-02 NOTE — Patient Instructions (Signed)
claritin or allegra daily for 14 days and flonase daily for 14 days.

## 2020-02-02 NOTE — Progress Notes (Signed)
Patient ID: Katherine Hoffman, female    DOB: 17-May-1940, 80 y.o.   MRN: [id number]   Chief Complaint  Patient presents with  . Otalgia    Both ears feel stopped up and left ear is hurting   Subjective:     HPI  CC- ear clogging feeling. Feeling stopped up both sides. And left ear hurting. Tried allergy medication otc.  Pt called Dr. Benjamine Mola, ENT and was unable to get in for a few days. And was told to see pcp for her ears.   Also having itching on scalp and small bumps on top of scalp.  Used to use ketoconazole cream on it for a few days and it cleared up. Patient would also like a script for ketoconazole cream for scalp itch  Medical History Serra has a past medical history of Diverticulosis, GERD (gastroesophageal reflux disease), and Hemorrhoids.   Outpatient Encounter Medications as of 02/02/2020  Medication Sig  . ketoconazole (NIZORAL) 2 % cream Apply 1 application topically daily.  Marland Kitchen acetaminophen (TYLENOL) 650 MG CR tablet Take 650 mg by mouth every 8 (eight) hours as needed for pain.  Marland Kitchen Apple Cid Vn-Grn Tea-Bit Or-Cr (APPLE CIDER VINEGAR PLUS PO) Take by mouth.  Marland Kitchen aspirin EC 81 MG tablet Take 81 mg by mouth daily.  Marland Kitchen atorvastatin (LIPITOR) 20 MG tablet Take 1 tablet (20 mg total) by mouth daily.  . blood glucose meter kit and supplies KIT Dispense based on patient and insurance preference. Use to test glucose once daily. ICD - 10 code E11.9  . CINNAMON PO Take 1,000 mg by mouth daily.  . Cranberry (SM CRANBERRY) 300 MG tablet Take 300 mg by mouth daily.  Marland Kitchen glucose blood (ONETOUCH VERIO) test strip TEST GLUCOSE ONCE DAILY  . Multiple Vitamin (MULTIVITAMIN) capsule Take 1 capsule by mouth daily.  . naproxen (NAPROSYN) 500 MG tablet Take 1 tablet (500 mg total) by mouth 2 (two) times daily as needed.  . Omega-3 Fatty Acids (FISH OIL) 1000 MG CAPS Take 1 capsule by mouth daily.  Marland Kitchen omeprazole (PRILOSEC) 40 MG capsule Take 1 capsule (40 mg total) by mouth daily.  Glory Rosebush ULTRA test strip TESTING ONCE DAILY.   No facility-administered encounter medications on file as of 02/02/2020.     Review of Systems  Constitutional: Negative for chills and fever.  HENT: Positive for ear pain (left) and hearing loss. Negative for congestion, ear discharge, rhinorrhea, sinus pressure, sinus pain and sore throat.   Eyes: Negative for pain, discharge and itching.  Respiratory: Negative for cough.   Gastrointestinal: Negative for constipation, diarrhea, nausea and vomiting.  Skin: Positive for rash (scalp itching).     Vitals BP 128/82   Pulse 83   Temp (!) 97.4 F (36.3 C) (Oral)   Ht '5\' 1"'  (1.549 m)   Wt 103 lb 3.2 oz (46.8 kg)   SpO2 96%   BMI 19.50 kg/m   Objective:   Physical Exam Vitals and nursing note reviewed.  Constitutional:      General: She is not in acute distress.    Appearance: Normal appearance. She is not ill-appearing or toxic-appearing.  HENT:     Head: Normocephalic and atraumatic.     Right Ear: Ear canal and external ear normal.     Left Ear: Tympanic membrane, ear canal and external ear normal.     Ears:     Comments: Cloudiness on rt TM, no injection, or erythema.     Nose: Nose  normal. No congestion or rhinorrhea.     Mouth/Throat:     Mouth: Mucous membranes are moist.     Pharynx: Oropharynx is clear. No oropharyngeal exudate or posterior oropharyngeal erythema.  Eyes:     Extraocular Movements: Extraocular movements intact.     Conjunctiva/sclera: Conjunctivae normal.     Pupils: Pupils are equal, round, and reactive to light.  Pulmonary:     Effort: Pulmonary effort is normal. No respiratory distress.  Musculoskeletal:     Cervical back: Normal range of motion.  Lymphadenopathy:     Cervical: No cervical adenopathy.  Skin:    General: Skin is warm and dry.     Findings: No rash.  Neurological:     Mental Status: She is alert and oriented to person, place, and time.  Psychiatric:        Mood and Affect:  Mood normal.        Behavior: Behavior normal.      Assessment and Plan   1. Non-recurrent acute serous otitis media of both ears  2. Decreased hearing of both ears  3. Seborrheic dermatitis of scalp - ketoconazole (NIZORAL) 2 % cream; Apply 1 application topically daily.  Dispense: 30 g; Refill: 0   Serous effusion and dec hearing- recommending -flonase daily and claritin/allegra daily for 14 days. If not improving in 2 wks f/u with Dr. Benjamine Mola, ENT.  Gave refill for ketoconazole for seb derm.  Pt in agreement.  F/u prn.

## 2020-02-07 DIAGNOSIS — H40051 Ocular hypertension, right eye: Secondary | ICD-10-CM | POA: Diagnosis not present

## 2020-02-18 ENCOUNTER — Telehealth: Payer: Self-pay | Admitting: Family Medicine

## 2020-02-18 ENCOUNTER — Other Ambulatory Visit: Payer: Self-pay | Admitting: *Deleted

## 2020-02-18 NOTE — Telephone Encounter (Signed)
[facility] pharmacy requesting refill on Omeprazole 40 mg capsule Atorvastatin 20 mg tablet Naproxen 500 mg tablet.  Pt last seen 02/02/20 for ear pain. Please advise. Thank you

## 2020-02-18 NOTE — Telephone Encounter (Signed)
error 

## 2020-02-19 MED ORDER — ATORVASTATIN CALCIUM 20 MG PO TABS
20.0000 mg | ORAL_TABLET | Freq: Every day | ORAL | 1 refills | Status: DC
Start: 2020-02-19 — End: 2020-06-29

## 2020-02-19 MED ORDER — NAPROXEN 500 MG PO TABS
500.0000 mg | ORAL_TABLET | Freq: Two times a day (BID) | ORAL | 2 refills | Status: DC | PRN
Start: 2020-02-19 — End: 2020-05-01

## 2020-02-19 MED ORDER — OMEPRAZOLE 40 MG PO CPDR
40.0000 mg | DELAYED_RELEASE_CAPSULE | Freq: Every day | ORAL | 1 refills | Status: DC
Start: 2020-02-19 — End: 2020-04-04

## 2020-02-19 NOTE — Telephone Encounter (Signed)
Sent. Thx. Dr. T 

## 2020-03-28 ENCOUNTER — Other Ambulatory Visit: Payer: Self-pay

## 2020-03-28 ENCOUNTER — Telehealth: Payer: Self-pay | Admitting: Family Medicine

## 2020-03-28 ENCOUNTER — Ambulatory Visit (INDEPENDENT_AMBULATORY_CARE_PROVIDER_SITE_OTHER): Payer: Medicare HMO | Admitting: Family Medicine

## 2020-03-28 ENCOUNTER — Encounter: Payer: Self-pay | Admitting: Family Medicine

## 2020-03-28 VITALS — BP 120/80 | HR 54 | Temp 97.4°F | Ht 61.0 in | Wt 105.0 lb

## 2020-03-28 DIAGNOSIS — F5101 Primary insomnia: Secondary | ICD-10-CM | POA: Diagnosis not present

## 2020-03-28 DIAGNOSIS — E1169 Type 2 diabetes mellitus with other specified complication: Secondary | ICD-10-CM | POA: Diagnosis not present

## 2020-03-28 DIAGNOSIS — E785 Hyperlipidemia, unspecified: Secondary | ICD-10-CM

## 2020-03-28 DIAGNOSIS — R7301 Impaired fasting glucose: Secondary | ICD-10-CM | POA: Diagnosis not present

## 2020-03-28 DIAGNOSIS — E119 Type 2 diabetes mellitus without complications: Secondary | ICD-10-CM | POA: Diagnosis not present

## 2020-03-28 NOTE — Progress Notes (Signed)
Patient ID: Katherine Hoffman, female    DOB: [birth year], 80 y.o.   MRN: [id number]   Chief Complaint  Patient presents with  . Diabetes   Subjective:    HPI Pt here for follow up on DM. Pt states she is doing fine except for not being able to gain weight. Pt checking sugars a couple times a week. Pt does see eye doctor; had exam on Feb 07, 2020.  H/o dm2- not on meds.  Pt taking cinammon.  Has large family history of DM2.  Pt stating eating a good amt, but not gaining weight.  Feeling eating 3x per day and snacking. Was at 11, and inc to 105 lbs.  And staring to eat more.    Insomnia- didn't take any meds.  But slept good last night.  Some night awake at 2am till 7am. Was on melatonin. And didn't feel it was helping. Worked together stopped the trazodone and xanax over past year. More issues with bad sleeping per week. Turn tv off at 10pm.  Normally will go to sleep then waking up from 1-2am. Thinking father had insomnia also.  Normally walking, but not when cold. Not a napper during day.  Medical History  Katherine Hoffman has a past medical history of Diverticulosis, GERD (gastroesophageal reflux disease), and Hemorrhoids.   Outpatient Encounter Medications as of 03/28/2020  Medication Sig  . acetaminophen (TYLENOL) 650 MG CR tablet Take 650 mg by mouth every 8 (eight) hours as needed for pain.  Marland Kitchen Apple Cid Vn-Grn Tea-Bit Or-Cr (APPLE CIDER VINEGAR PLUS PO) Take by mouth.  Marland Kitchen aspirin EC 81 MG tablet Take 81 mg by mouth daily.  Marland Kitchen atorvastatin (LIPITOR) 20 MG tablet Take 1 tablet (20 mg total) by mouth daily.  . blood glucose meter kit and supplies KIT Dispense based on patient and insurance preference. Use to test glucose once daily. ICD - 10 code E11.9  . CINNAMON PO Take 1,000 mg by mouth daily.  . Cranberry 300 MG tablet Take 300 mg by mouth daily.  Marland Kitchen glucose blood (ONETOUCH VERIO) test strip TEST GLUCOSE ONCE DAILY  . ketoconazole (NIZORAL) 2 % cream Apply 1  application topically daily.  . Multiple Vitamin (MULTIVITAMIN) capsule Take 1 capsule by mouth daily.  . naproxen (NAPROSYN) 500 MG tablet Take 1 tablet (500 mg total) by mouth 2 (two) times daily as needed.  . Omega-3 Fatty Acids (FISH OIL) 1000 MG CAPS Take 1 capsule by mouth daily.  Katherine Hoffman ULTRA test strip TESTING ONCE DAILY.  . [DISCONTINUED] omeprazole (PRILOSEC) 40 MG capsule Take 1 capsule (40 mg total) by mouth daily.   No facility-administered encounter medications on file as of 03/28/2020.     Review of Systems  Constitutional: Negative for chills and fever.  HENT: Negative for congestion, rhinorrhea and sore throat.   Respiratory: Negative for cough, shortness of breath and wheezing.   Cardiovascular: Negative for chest pain and leg swelling.  Gastrointestinal: Negative for abdominal pain, diarrhea, nausea and vomiting.  Genitourinary: Negative for dysuria and frequency.  Musculoskeletal: Negative for arthralgias and back pain.  Skin: Negative for rash.  Neurological: Negative for dizziness, weakness and headaches.     Vitals BP 120/80   Pulse (!) 54   Temp (!) 97.4 F (36.3 C)   Ht _0  (1.549 m)   Wt 105 lb (47.6 kg)   SpO2 (!) 87%   BMI 19.84 kg/m   Objective:   Physical Exam Vitals and nursing note  reviewed.  Constitutional:      Appearance: Normal appearance.  HENT:     Head: Normocephalic and atraumatic.     Nose: Nose normal.     Mouth/Throat:     Mouth: Mucous membranes are moist.     Pharynx: Oropharynx is clear.  Eyes:     Extraocular Movements: Extraocular movements intact.     Conjunctiva/sclera: Conjunctivae normal.     Pupils: Pupils are equal, round, and reactive to light.  Cardiovascular:     Rate and Rhythm: Regular rhythm. Bradycardia present.     Pulses: Normal pulses.     Heart sounds: Normal heart sounds.  Pulmonary:     Effort: Pulmonary effort is normal.     Breath sounds: Normal breath sounds. No wheezing, rhonchi or  rales.  Musculoskeletal:        General: Normal range of motion.     Right lower leg: No edema.     Left lower leg: No edema.  Skin:    General: Skin is warm and dry.     Findings: No lesion or rash.  Neurological:     General: No focal deficit present.     Mental Status: She is alert and oriented to person, place, and time.  Psychiatric:        Mood and Affect: Mood normal.        Behavior: Behavior normal.      Assessment and Plan   1. Type 2 diabetes mellitus without complication, without long-term current use of insulin ([place])  2. Impaired fasting glucose  3. Primary insomnia  4. Hyperlipidemia associated with type 2 diabetes mellitus ([place]) - CBC - CMP14+EGFR - Lipid panel   DM2-stable patient is not currently on medications.  Will recheck labs.  Patient to continue diet modification.  Insomnia-uncontrolled. Patient was weaned off Xanax, last year. Patient tried trazodone, stated it did not work well for her. Not interested in trying gabapentin, thought that might be a good one to try in low does for her insomnia.  Hyperlipidemia-stable.  Will recheck labs.  Patient to continue Lipitor.  F/u prn.  Return in about 6 months (around 09/28/2020) for f/u hld.

## 2020-03-28 NOTE — Telephone Encounter (Signed)
Error

## 2020-04-04 ENCOUNTER — Other Ambulatory Visit: Payer: Self-pay | Admitting: Family Medicine

## 2020-04-29 ENCOUNTER — Other Ambulatory Visit: Payer: Self-pay | Admitting: Family Medicine

## 2020-06-29 ENCOUNTER — Other Ambulatory Visit: Payer: Self-pay

## 2020-06-29 MED ORDER — ATORVASTATIN CALCIUM 20 MG PO TABS: 20.0000 mg | ORAL_TABLET | Freq: Every day | ORAL | 1 refills | Status: DC

## 2020-07-13 ENCOUNTER — Ambulatory Visit
Admission: EM | Admit: 2020-07-13 | Discharge: 2020-07-13 | Disposition: A | Discharge status: Home / Self Care | Payer: Medicare HMO | Attending: Emergency Medicine | Admitting: Emergency Medicine

## 2020-07-13 ENCOUNTER — Encounter: Payer: Self-pay | Admitting: Emergency Medicine

## 2020-07-13 DIAGNOSIS — H8112 Benign paroxysmal vertigo, left ear: Secondary | ICD-10-CM

## 2020-07-13 DIAGNOSIS — H66002 Acute suppurative otitis media without spontaneous rupture of ear drum, left ear: Secondary | ICD-10-CM | POA: Diagnosis not present

## 2020-07-13 MED ORDER — AZITHROMYCIN 250 MG PO TABS: 250.0000 mg | ORAL_TABLET | Freq: Every day | ORAL | 0 refills | Status: DC

## 2020-07-13 MED ORDER — MECLIZINE HCL 12.5 MG PO TABS: 12.5000 mg | ORAL_TABLET | Freq: Two times a day (BID) | ORAL | 0 refills | Status: DC | PRN

## 2020-07-13 NOTE — ED Triage Notes (Signed)
States she feels like she has a motor in her ear.

## 2020-07-13 NOTE — ED Provider Notes (Signed)
[facility]   [id number] 07/13/20 Arrival Time: 0823  WC:[id number]  SUBJECTIVE:  Katherine Hoffman is a 80 y.o. female who presents with complaint of dizziness that began this morning.  Denies a precipitating event, trauma, or recent URI within the past month.  Describes the dizziness as "the room spinning."  States that it is intermittent with episodes lasting few minutes at a time.  Denies alleviating factors.  Worse with position changes.  Denies to previous symptoms.  Complains of associated ear discomfort, and ringing in LT ear.  Denies fever, chills, nausea, vomiting, hearing changes, tinnitus, chest pain, syncope, SOB, weakness, slurred speech, memory or emotional changes, facial drooping/ asymmetry, incoordination, numbness or tingling, abdominal pain, changes in bowel or bladder habits.    ROS: As per HPI.  All other pertinent ROS negative.    Past Medical History:  Diagnosis Date   Diverticulosis    GERD (gastroesophageal reflux disease)    Hemorrhoids    Past Surgical History:  Procedure Laterality Date   COLONOSCOPY   12/23/2005   Dr. Rourk:Few scattered pancolonic diverticula/Remainder of the colonic mucosa appeared normal/normal rectum   COLONOSCOPY N/A 05/14/2013   Dr.Rourk- colonic diverticulosis. internal hemorrhoids   ESOPHAGOGASTRODUODENOSCOPY N/A 07/28/2014   Dr. Gala Romney: tiny hiatal hernia, abnormal gastric mucosa s/p gastric biopsy revealing chronic gastritis   FOOT SURGERY     Allergies  Allergen Reactions   Keflex [Cephalexin] Anaphylaxis and Rash   Theophyllines Anaphylaxis and Rash   Codeine Other (See Comments)    "it makes me pass out."   Influenza Vaccines    No current facility-administered medications on file prior to encounter.   Current Outpatient Medications on File Prior to Encounter  Medication Sig Dispense Refill   acetaminophen (TYLENOL) 650 MG CR tablet Take 650 mg by mouth every 8 (eight) hours as needed for pain.     Apple Cid  Vn-Grn Tea-Bit Or-Cr (APPLE CIDER VINEGAR PLUS PO) Take by mouth.     aspirin EC 81 MG tablet Take 81 mg by mouth daily.     atorvastatin (LIPITOR) 20 MG tablet Take 1 tablet (20 mg total) by mouth daily. 90 tablet 1   blood glucose meter kit and supplies KIT Dispense based on patient and insurance preference. Use to test glucose once daily. ICD - 10 code E11.9 1 each 0   CINNAMON PO Take 1,000 mg by mouth daily.     Cranberry 300 MG tablet Take 300 mg by mouth daily.     glucose blood (ONETOUCH VERIO) test strip TEST GLUCOSE ONCE DAILY 100 strip 2   ketoconazole (NIZORAL) 2 % cream Apply 1 application topically daily. 30 g 0   Multiple Vitamin (MULTIVITAMIN) capsule Take 1 capsule by mouth daily.     naproxen (NAPROSYN) 500 MG tablet TAKE 1 TABLET (500 MG TOTAL) BY MOUTH 2 (TWO) TIMES DAILY AS NEEDED. 60 tablet 2   Omega-3 Fatty Acids (FISH OIL) 1000 MG CAPS Take 1 capsule by mouth daily.     omeprazole (PRILOSEC) 40 MG capsule TAKE 1 CAPSULE BY MOUTH EVERY DAY 90 capsule 0   ONETOUCH ULTRA test strip TESTING ONCE DAILY. 50 each 0   Social History   Socioeconomic History   Marital status: Widowed    Spouse name: Not on file   Number of children: Not on file   Years of education: Not on file   Highest education level: Not on file  Occupational History   Occupation: Retired    Comment: Optometrist  Tobacco  Tobacco Use   Smoking status: Never   Smokeless tobacco: Never  Substance and Sexual Activity   Alcohol use: No   Drug use: No   Sexual activity: Not on file  Other Topics Concern   Not on file  Social History Narrative   Not on file   Social Determinants of Health   Financial Resource Strain: Not on file  Food Insecurity: Not on file  Transportation Needs: Not on file  Physical Activity: Not on file  Stress: Not on file  Social Connections: Not on file  Intimate Partner Violence: Not on file   Family History  Problem Relation Age of Onset   Heart attack Mother     Diabetes Father    Colon cancer Other        POSSIBLY FATHER    OBJECTIVE:  Vitals:   07/13/20 0843  BP: (!) 174/82  Pulse: 66  Resp: 16  Temp: 98.3 F (36.8 C)  TempSrc: Oral  SpO2: 98%    General appearance: alert; no distress Eyes: PERRLA; EOMI; conjunctiva normal HENT: normocephalic; atraumatic; RT TM normal, LT TM mildly erythematous; nasal mucosa normal; oral mucosa normal Neck: supple with FROM Lungs: clear to auscultation bilaterally Heart: regular rate and rhythm Extremities: no cyanosis or edema; symmetrical with no gross deformities Skin: warm and dry Neurologic: normal gait; strength intact about the upper and lower extremities Psychological: alert and cooperative; normal mood and affect  ASSESSMENT & PLAN:  1. Non-recurrent acute suppurative otitis media of left ear without spontaneous rupture of tympanic membrane   2. Benign paroxysmal positional vertigo of left ear     Meds ordered this encounter  Medications   azithromycin (ZITHROMAX) 250 MG tablet    Sig: Take 1 tablet (250 mg total) by mouth daily. Take first 2 tablets together, then 1 every day until finished.    Dispense:  6 tablet    Refill:  0    Order Specific Question:   Supervising Provider    Answer:   NELSON, YVONNE SUE [[id number]]   meclizine (ANTIVERT) 12.5 MG tablet    Sig: Take 1 tablet (12.5 mg total) by mouth 2 (two) times daily as needed for dizziness.    Dispense:  15 tablet    Refill:  0    Order Specific Question:   Supervising Provider    Answer:   NELSON, YVONNE SUE [[id number]]   Azithromycin for ear infection Meclizine prescribed for dizziness.  Take as directed for symptomatic relief.  This medication may make you drowsy so use with cautions while driving or operating heavy machinery. Follow up with PCP if symptoms persists Return or go to the ER if you have any new or worsening symptoms fever, chills, nausea, vomiting, slurred speech, facial droop, weakness, chest pain,  shortness of breath, etc...  Reviewed expectations re: course of current medical issues. Questions answered. Outlined signs and symptoms indicating need for more acute intervention. Patient verbalized understanding. After Visit Summary given.     Wurst, Brittany, PA-C 07/13/20 0926  

## 2020-07-13 NOTE — ED Triage Notes (Signed)
Woke up this morning and states her balance was off, states balance was worse at 2am this morning.  States her head does not feel right.  States symptoms are worse when she stands up.

## 2020-07-13 NOTE — Discharge Instructions (Addendum)
Azithromycin for ear infection Meclizine prescribed for dizziness.  Take as directed for symptomatic relief.  This medication may make you drowsy so use with cautions while driving or operating heavy machinery. Follow up with PCP if symptoms persists Return or go to the ER if you have any new or worsening symptoms fever, chills, nausea, vomiting, slurred speech, facial droop, weakness, chest pain, shortness of breath, etc..Marland Kitchen

## 2020-08-09 DIAGNOSIS — E782 Mixed hyperlipidemia: Secondary | ICD-10-CM | POA: Diagnosis not present

## 2020-08-09 DIAGNOSIS — I10 Essential (primary) hypertension: Secondary | ICD-10-CM | POA: Diagnosis not present

## 2020-08-09 DIAGNOSIS — K219 Gastro-esophageal reflux disease without esophagitis: Secondary | ICD-10-CM | POA: Diagnosis not present

## 2020-08-09 DIAGNOSIS — R7303 Prediabetes: Secondary | ICD-10-CM | POA: Diagnosis not present

## 2020-08-09 DIAGNOSIS — Z0189 Encounter for other specified special examinations: Secondary | ICD-10-CM | POA: Diagnosis not present

## 2020-09-21 ENCOUNTER — Telehealth: Payer: Self-pay | Admitting: Family Medicine

## 2020-09-21 ENCOUNTER — Other Ambulatory Visit: Payer: Self-pay | Admitting: Family Medicine

## 2020-09-21 MED ORDER — OMEPRAZOLE 40 MG PO CPDR
DELAYED_RELEASE_CAPSULE | ORAL | 0 refills | Status: DC
Start: 2020-09-21 — End: 2020-09-28

## 2020-09-21 NOTE — Telephone Encounter (Signed)
Patient is requesting refill on omeprazole 40 mg just enough to get her to follow up appointment on 9/2. [facility] please advise

## 2020-09-22 NOTE — Telephone Encounter (Signed)
Error please close

## 2020-09-28 ENCOUNTER — Encounter: Payer: Self-pay | Admitting: Family Medicine

## 2020-09-28 ENCOUNTER — Other Ambulatory Visit: Payer: Self-pay

## 2020-09-28 ENCOUNTER — Ambulatory Visit (INDEPENDENT_AMBULATORY_CARE_PROVIDER_SITE_OTHER): Payer: Medicare HMO | Admitting: Family Medicine

## 2020-09-28 ENCOUNTER — Telehealth: Payer: Self-pay | Admitting: Family Medicine

## 2020-09-28 VITALS — BP 178/79 | HR 62 | Ht 61.0 in | Wt 104.8 lb

## 2020-09-28 DIAGNOSIS — K219 Gastro-esophageal reflux disease without esophagitis: Secondary | ICD-10-CM | POA: Diagnosis not present

## 2020-09-28 DIAGNOSIS — I10 Essential (primary) hypertension: Secondary | ICD-10-CM | POA: Diagnosis not present

## 2020-09-28 DIAGNOSIS — E1169 Type 2 diabetes mellitus with other specified complication: Secondary | ICD-10-CM | POA: Diagnosis not present

## 2020-09-28 DIAGNOSIS — E119 Type 2 diabetes mellitus without complications: Secondary | ICD-10-CM | POA: Diagnosis not present

## 2020-09-28 DIAGNOSIS — E785 Hyperlipidemia, unspecified: Secondary | ICD-10-CM

## 2020-09-28 MED ORDER — NAPROXEN 500 MG PO TABS: ORAL_TABLET | ORAL | 1 refills | Status: DC

## 2020-09-28 MED ORDER — ATORVASTATIN CALCIUM 20 MG PO TABS: 20.0000 mg | ORAL_TABLET | Freq: Every day | ORAL | 1 refills | Status: AC

## 2020-09-28 MED ORDER — OMEPRAZOLE 40 MG PO CPDR: DELAYED_RELEASE_CAPSULE | ORAL | 1 refills | Status: AC

## 2020-09-28 MED ORDER — LISINOPRIL 5 MG PO TABS: 5.0000 mg | ORAL_TABLET | Freq: Every day | ORAL | 0 refills | Status: DC

## 2020-09-28 NOTE — Telephone Encounter (Signed)
Patient advised per Dr Ladona Ridgel: Dr Ladona Ridgel reviewed labs.  Just slight elevation in a1c at 5.8.  Needing to watch carbs in the diet.  And will send her refills in for 6 months and pt needs to f/u with new provider for refills.  Pt to continue to take lisinopril 5mg  as discussed today.  Check bp daily for 7 days and call with numbers. Would call to see her new doctor in 3 months for a recheck of her bp in case her medication needs adjusting.   Patient verbalized understanding.

## 2020-09-28 NOTE — Patient Instructions (Signed)
Check blood pressure daily for 7 days, cal lwith numbers next week.  If seeing 140/85 or over for 3 x then call office.   If seeing 100/70 or under call office.  Take lisinopril daily.

## 2020-09-28 NOTE — Progress Notes (Signed)
Patient ID: Katherine Hoffman, female    DOB: [birth year], 80 y.o.   MRN: [id number]   Chief Complaint  Patient presents with   Hyperlipidemia   Gastroesophageal Reflux  Medication refills x 4 months Subjective:    HPI Pt f/u on gerd and hld. Taking omeprazole and doing well and no reflux.  No abd pain.  No bloody or black stools.   Pt stating she is going to get a new pcp soon, Dr. [place] Crane.  Pt not had an appt 02/14/21. With Dr. [place] Crane.  H/o diabetes- diet controlled.  Checking glucose occ. Not on meds. Strong family history with siblings that all has diabetes.    Has h/o white coat syndrome.  BP goes up when in office.    Medical History Katherine Hoffman has a past medical history of Diverticulosis, GERD (gastroesophageal reflux disease), and Hemorrhoids.   Outpatient Encounter Medications as of 09/28/2020  Medication Sig   aspirin EC 81 MG tablet Take 81 mg by mouth daily.   CINNAMON PO Take 1,000 mg by mouth daily.   Cranberry 300 MG tablet Take 300 mg by mouth daily.   glucose blood (ONETOUCH VERIO) test strip TEST GLUCOSE ONCE DAILY   ketoconazole (NIZORAL) 2 % cream Apply 1 application topically daily.   lisinopril (ZESTRIL) 5 MG tablet Take 1 tablet (5 mg total) by mouth daily.   Multiple Vitamin (MULTIVITAMIN) capsule Take 1 capsule by mouth daily.   Omega-3 Fatty Acids (FISH OIL) 1000 MG CAPS Take 1 capsule by mouth daily.   ONETOUCH ULTRA test strip TESTING ONCE DAILY.   [DISCONTINUED] atorvastatin (LIPITOR) 20 MG tablet Take 1 tablet (20 mg total) by mouth daily.   [DISCONTINUED] naproxen (NAPROSYN) 500 MG tablet TAKE 1 TABLET (500 MG TOTAL) BY MOUTH 2 (TWO) TIMES DAILY AS NEEDED.   [DISCONTINUED] omeprazole (PRILOSEC) 40 MG capsule TAKE 1 CAPSULE BY MOUTH EVERY DAY   acetaminophen (TYLENOL) 650 MG CR tablet Take 650 mg by mouth every 8 (eight) hours as needed for pain. (Patient not taking: Reported on 09/28/2020)   Apple Cid Vn-Grn Tea-Bit Or-Cr (APPLE CIDER VINEGAR PLUS  PO) Take by mouth. (Patient not taking: Reported on 09/28/2020)   atorvastatin (LIPITOR) 20 MG tablet Take 1 tablet (20 mg total) by mouth daily.   blood glucose meter kit and supplies KIT Dispense based on patient and insurance preference. Use to test glucose once daily. ICD - 10 code E11.9 (Patient not taking: Reported on 09/28/2020)   meclizine (ANTIVERT) 12.5 MG tablet Take 1 tablet (12.5 mg total) by mouth 2 (two) times daily as needed for dizziness. (Patient not taking: Reported on 09/28/2020)   naproxen (NAPROSYN) 500 MG tablet Take 1 tab p.o. daily with food.   omeprazole (PRILOSEC) 40 MG capsule TAKE 1 cap qod.   [DISCONTINUED] azithromycin (ZITHROMAX) 250 MG tablet Take 1 tablet (250 mg total) by mouth daily. Take first 2 tablets together, then 1 every day until finished.   No facility-administered encounter medications on file as of 09/28/2020.     Review of Systems  Constitutional:  Negative for chills and fever.  HENT:  Negative for congestion, rhinorrhea and sore throat.   Respiratory:  Negative for cough, shortness of breath and wheezing.   Cardiovascular:  Negative for chest pain and leg swelling.  Gastrointestinal:  Negative for abdominal pain, diarrhea, nausea and vomiting.  Genitourinary:  Negative for dysuria and frequency.  Musculoskeletal:  Negative for arthralgias and back pain.  Skin:  Negative for rash.  Neurological:  Negative for dizziness, weakness and headaches.  Psychiatric/Behavioral:  Negative for dysphoric mood. The patient is nervous/anxious.     Vitals BP (!) 178/79   Pulse 62   Ht _0  (1.549 m)   Wt 104 lb 12.8 oz (47.5 kg)   SpO2 97%   BMI 19.80 kg/m   Objective:   Physical Exam Vitals and nursing note reviewed.  Constitutional:      General: She is not in acute distress.    Appearance: Normal appearance.  HENT:     Head: Normocephalic and atraumatic.     Nose: Nose normal.     Mouth/Throat:     Mouth: Mucous membranes are moist.      Pharynx: Oropharynx is clear.  Eyes:     Extraocular Movements: Extraocular movements intact.     Conjunctiva/sclera: Conjunctivae normal.     Pupils: Pupils are equal, round, and reactive to light.  Cardiovascular:     Rate and Rhythm: Normal rate and regular rhythm.     Pulses: Normal pulses.     Heart sounds: Normal heart sounds.  Pulmonary:     Effort: Pulmonary effort is normal.     Breath sounds: Normal breath sounds. No wheezing, rhonchi or rales.  Musculoskeletal:        General: Normal range of motion.     Right lower leg: No edema.     Left lower leg: No edema.  Skin:    General: Skin is warm and dry.     Findings: No lesion or rash.  Neurological:     General: No focal deficit present.     Mental Status: She is alert and oriented to person, place, and time.  Psychiatric:        Behavior: Behavior normal.     Comments: +anxious     Assessment and Plan   1. Type 2 diabetes mellitus without complication, without long-term current use of insulin ([place])  2. Hyperlipidemia associated with type 2 diabetes mellitus ([place])  3. Primary hypertension  4. Gastroesophageal reflux disease without esophagitis   BP Readings from Last 3 Encounters:  09/28/20 (!) 178/79  07/13/20 (!) 174/82  03/28/20 120/80   Htn- uncontrolled. May have some component of anxiety today, since concerned about finding a new doctor and needing her refills.  Pt has elevated blood pressure will give small dose lisinopril 74m.  Pt advised to get labs [place] and [place] to [place]  Pt will get labs from other office.  Pt advised to follow up with new doctor there for her bp and other medication refills.   Bp very elevated, pt stating has a lot of stress.  Will give small amt of medication to help with her bp and advising to check at home daily and call office back with numbers and f/u with new doctor for a recheck in 30 days.  Addendum- Pt brought labs done 7/22 from new provider-  A1c at  5.8.  Advised to dec carbs in diet. Scanned into media file.  Cr normal. All other labs stable for cmp, cbc.  Return in about 3 months (around 12/28/2020) for f/u htn.

## 2020-09-28 NOTE — Telephone Encounter (Signed)
Pls call pt and let her know I reviewed labs.  Just slight elevation in a1c at 5.8.  Needing to watch carbs in the diet.  And will send her refills in for 6 mo and pt needs to f/u with new provider for refills.  Pt to cont to take lisinopril 5mg  as discussed today.  Check bp daily for 7 days and call with numbers. Would call to see her new doctor in 3 months for a recheck of her bp in case her medication needs adjusting.   Thanks, Dr. [place]

## 2020-10-06 ENCOUNTER — Telehealth: Payer: Self-pay | Admitting: Family Medicine

## 2020-10-06 NOTE — Telephone Encounter (Signed)
Pt states she is taking Lisinopril 5 mg. No chest pain, shortness of breath, leg swelling, headache or blurry vision. Please advise. Thank you  If pt is not home can leave detailed message on answering machine.

## 2020-10-06 NOTE — Telephone Encounter (Signed)
Please advise.   (Pt is to be seeing Dr.Hall)

## 2020-10-06 NOTE — Telephone Encounter (Signed)
Patient called in with the following B/P readings Friday 139/60 Saturday 134/56 Sunday 132/65 Monday 142/66 Tuesday 154/81 Wednesday 138/78 Thursday 146/69  CB# [phone]

## 2020-10-09 NOTE — Telephone Encounter (Signed)
Pt contacted and verbalized understanding.  

## 2020-11-09 DIAGNOSIS — H40051 Ocular hypertension, right eye: Secondary | ICD-10-CM | POA: Diagnosis not present

## 2020-11-09 DIAGNOSIS — H04123 Dry eye syndrome of bilateral lacrimal glands: Secondary | ICD-10-CM | POA: Diagnosis not present

## 2020-11-09 DIAGNOSIS — Z961 Presence of intraocular lens: Secondary | ICD-10-CM | POA: Diagnosis not present

## 2020-11-09 DIAGNOSIS — H5203 Hypermetropia, bilateral: Secondary | ICD-10-CM | POA: Diagnosis not present

## 2020-12-20 DIAGNOSIS — M1990 Unspecified osteoarthritis, unspecified site: Secondary | ICD-10-CM | POA: Diagnosis not present

## 2020-12-20 DIAGNOSIS — E785 Hyperlipidemia, unspecified: Secondary | ICD-10-CM | POA: Diagnosis not present

## 2020-12-20 DIAGNOSIS — K219 Gastro-esophageal reflux disease without esophagitis: Secondary | ICD-10-CM | POA: Diagnosis not present

## 2020-12-20 DIAGNOSIS — I10 Essential (primary) hypertension: Secondary | ICD-10-CM | POA: Diagnosis not present

## 2021-02-09 DIAGNOSIS — I10 Essential (primary) hypertension: Secondary | ICD-10-CM | POA: Diagnosis not present

## 2021-02-14 DIAGNOSIS — E782 Mixed hyperlipidemia: Secondary | ICD-10-CM | POA: Diagnosis not present

## 2021-02-14 DIAGNOSIS — R7303 Prediabetes: Secondary | ICD-10-CM | POA: Diagnosis not present

## 2021-02-14 DIAGNOSIS — M151 Heberden's nodes (with arthropathy): Secondary | ICD-10-CM | POA: Diagnosis not present

## 2021-02-14 DIAGNOSIS — K219 Gastro-esophageal reflux disease without esophagitis: Secondary | ICD-10-CM | POA: Diagnosis not present

## 2021-02-14 DIAGNOSIS — Z0001 Encounter for general adult medical examination with abnormal findings: Secondary | ICD-10-CM | POA: Diagnosis not present

## 2021-02-14 DIAGNOSIS — I10 Essential (primary) hypertension: Secondary | ICD-10-CM | POA: Diagnosis not present

## 2021-03-05 DIAGNOSIS — I10 Essential (primary) hypertension: Secondary | ICD-10-CM | POA: Diagnosis not present

## 2021-03-05 DIAGNOSIS — R42 Dizziness and giddiness: Secondary | ICD-10-CM | POA: Diagnosis not present

## 2021-03-05 DIAGNOSIS — F5104 Psychophysiologic insomnia: Secondary | ICD-10-CM | POA: Diagnosis not present

## 2021-03-12 DIAGNOSIS — I10 Essential (primary) hypertension: Secondary | ICD-10-CM | POA: Diagnosis not present

## 2021-03-12 DIAGNOSIS — F5104 Psychophysiologic insomnia: Secondary | ICD-10-CM | POA: Diagnosis not present

## 2021-03-26 DIAGNOSIS — X32XXXA Exposure to sunlight, initial encounter: Secondary | ICD-10-CM | POA: Diagnosis not present

## 2021-03-26 DIAGNOSIS — L570 Actinic keratosis: Secondary | ICD-10-CM | POA: Diagnosis not present

## 2021-03-26 DIAGNOSIS — B078 Other viral warts: Secondary | ICD-10-CM | POA: Diagnosis not present

## 2021-08-09 DIAGNOSIS — H401111 Primary open-angle glaucoma, right eye, mild stage: Secondary | ICD-10-CM | POA: Diagnosis not present

## 2021-08-16 DIAGNOSIS — H401111 Primary open-angle glaucoma, right eye, mild stage: Secondary | ICD-10-CM | POA: Diagnosis not present

## 2021-09-27 DIAGNOSIS — H40051 Ocular hypertension, right eye: Secondary | ICD-10-CM | POA: Diagnosis not present

## 2021-11-14 ENCOUNTER — Other Ambulatory Visit: Payer: Self-pay | Admitting: Family Medicine

## 2022-02-20 DIAGNOSIS — I10 Essential (primary) hypertension: Secondary | ICD-10-CM | POA: Diagnosis not present

## 2022-02-20 DIAGNOSIS — R7303 Prediabetes: Secondary | ICD-10-CM | POA: Diagnosis not present

## 2022-02-20 DIAGNOSIS — E785 Hyperlipidemia, unspecified: Secondary | ICD-10-CM | POA: Diagnosis not present

## 2022-02-26 DIAGNOSIS — E782 Mixed hyperlipidemia: Secondary | ICD-10-CM | POA: Diagnosis not present

## 2022-02-26 DIAGNOSIS — Z0000 Encounter for general adult medical examination without abnormal findings: Secondary | ICD-10-CM | POA: Diagnosis not present

## 2022-02-26 DIAGNOSIS — I10 Essential (primary) hypertension: Secondary | ICD-10-CM | POA: Diagnosis not present

## 2022-02-26 DIAGNOSIS — H6121 Impacted cerumen, right ear: Secondary | ICD-10-CM | POA: Diagnosis not present

## 2022-02-26 DIAGNOSIS — Z0001 Encounter for general adult medical examination with abnormal findings: Secondary | ICD-10-CM | POA: Diagnosis not present

## 2022-02-26 DIAGNOSIS — R944 Abnormal results of kidney function studies: Secondary | ICD-10-CM | POA: Diagnosis not present

## 2022-02-26 DIAGNOSIS — M151 Heberden's nodes (with arthropathy): Secondary | ICD-10-CM | POA: Diagnosis not present

## 2022-02-26 DIAGNOSIS — K219 Gastro-esophageal reflux disease without esophagitis: Secondary | ICD-10-CM | POA: Diagnosis not present

## 2022-02-26 DIAGNOSIS — R7303 Prediabetes: Secondary | ICD-10-CM | POA: Diagnosis not present

## 2022-02-26 DIAGNOSIS — Z79899 Other long term (current) drug therapy: Secondary | ICD-10-CM | POA: Diagnosis not present

## 2022-04-15 DIAGNOSIS — H40051 Ocular hypertension, right eye: Secondary | ICD-10-CM | POA: Diagnosis not present

## 2022-04-15 DIAGNOSIS — H04123 Dry eye syndrome of bilateral lacrimal glands: Secondary | ICD-10-CM | POA: Diagnosis not present

## 2022-10-14 DIAGNOSIS — H401131 Primary open-angle glaucoma, bilateral, mild stage: Secondary | ICD-10-CM | POA: Diagnosis not present

## 2022-10-14 DIAGNOSIS — H04123 Dry eye syndrome of bilateral lacrimal glands: Secondary | ICD-10-CM | POA: Diagnosis not present

## 2022-10-14 DIAGNOSIS — Z961 Presence of intraocular lens: Secondary | ICD-10-CM | POA: Diagnosis not present

## 2023-02-14 DIAGNOSIS — H401111 Primary open-angle glaucoma, right eye, mild stage: Secondary | ICD-10-CM | POA: Diagnosis not present

## 2023-02-21 DIAGNOSIS — I10 Essential (primary) hypertension: Secondary | ICD-10-CM | POA: Diagnosis not present

## 2023-02-21 DIAGNOSIS — R7303 Prediabetes: Secondary | ICD-10-CM | POA: Diagnosis not present

## 2023-02-28 DIAGNOSIS — Z0000 Encounter for general adult medical examination without abnormal findings: Secondary | ICD-10-CM | POA: Diagnosis not present

## 2023-02-28 DIAGNOSIS — R7303 Prediabetes: Secondary | ICD-10-CM | POA: Diagnosis not present

## 2023-02-28 DIAGNOSIS — Z0001 Encounter for general adult medical examination with abnormal findings: Secondary | ICD-10-CM | POA: Diagnosis not present

## 2023-02-28 DIAGNOSIS — I10 Essential (primary) hypertension: Secondary | ICD-10-CM | POA: Diagnosis not present

## 2023-02-28 DIAGNOSIS — E782 Mixed hyperlipidemia: Secondary | ICD-10-CM | POA: Diagnosis not present

## 2023-02-28 DIAGNOSIS — R944 Abnormal results of kidney function studies: Secondary | ICD-10-CM | POA: Diagnosis not present

## 2023-02-28 DIAGNOSIS — H401131 Primary open-angle glaucoma, bilateral, mild stage: Secondary | ICD-10-CM | POA: Diagnosis not present

## 2023-02-28 DIAGNOSIS — K219 Gastro-esophageal reflux disease without esophagitis: Secondary | ICD-10-CM | POA: Diagnosis not present

## 2023-02-28 DIAGNOSIS — M151 Heberden's nodes (with arthropathy): Secondary | ICD-10-CM | POA: Diagnosis not present

## 2023-02-28 DIAGNOSIS — H409 Unspecified glaucoma: Secondary | ICD-10-CM | POA: Diagnosis not present

## 2023-02-28 DIAGNOSIS — Z23 Encounter for immunization: Secondary | ICD-10-CM | POA: Diagnosis not present

## 2023-03-17 DIAGNOSIS — H04123 Dry eye syndrome of bilateral lacrimal glands: Secondary | ICD-10-CM | POA: Diagnosis not present

## 2023-03-17 DIAGNOSIS — H401134 Primary open-angle glaucoma, bilateral, indeterminate stage: Secondary | ICD-10-CM | POA: Diagnosis not present

## 2023-05-11 DIAGNOSIS — J00 Acute nasopharyngitis [common cold]: Secondary | ICD-10-CM | POA: Diagnosis not present

## 2023-05-12 DIAGNOSIS — H401131 Primary open-angle glaucoma, bilateral, mild stage: Secondary | ICD-10-CM | POA: Diagnosis not present

## 2023-05-13 DIAGNOSIS — S61411A Laceration without foreign body of right hand, initial encounter: Secondary | ICD-10-CM | POA: Diagnosis not present

## 2023-08-02 DIAGNOSIS — L0390 Cellulitis, unspecified: Secondary | ICD-10-CM | POA: Diagnosis not present

## 2023-08-02 DIAGNOSIS — Z681 Body mass index (BMI) 19 or less, adult: Secondary | ICD-10-CM | POA: Diagnosis not present

## 2023-08-02 DIAGNOSIS — W57XXXA Bitten or stung by nonvenomous insect and other nonvenomous arthropods, initial encounter: Secondary | ICD-10-CM | POA: Diagnosis not present

## 2023-08-06 DIAGNOSIS — R0789 Other chest pain: Secondary | ICD-10-CM | POA: Diagnosis not present

## 2023-08-06 DIAGNOSIS — R072 Precordial pain: Secondary | ICD-10-CM | POA: Diagnosis not present

## 2023-08-20 ENCOUNTER — Other Ambulatory Visit: Payer: Self-pay

## 2023-08-20 ENCOUNTER — Emergency Department (HOSPITAL_COMMUNITY)
Admission: EM | Admit: 2023-08-20 | Discharge: 2023-08-20 | Disposition: A | Discharge status: Home / Self Care | Attending: Emergency Medicine | Admitting: Emergency Medicine

## 2023-08-20 ENCOUNTER — Encounter (HOSPITAL_COMMUNITY): Payer: Self-pay | Admitting: Emergency Medicine

## 2023-08-20 ENCOUNTER — Emergency Department (HOSPITAL_COMMUNITY)

## 2023-08-20 DIAGNOSIS — W01198A Fall on same level from slipping, tripping and stumbling with subsequent striking against other object, initial encounter: Secondary | ICD-10-CM | POA: Insufficient documentation

## 2023-08-20 DIAGNOSIS — Z23 Encounter for immunization: Secondary | ICD-10-CM | POA: Insufficient documentation

## 2023-08-20 DIAGNOSIS — S0990XA Unspecified injury of head, initial encounter: Secondary | ICD-10-CM | POA: Diagnosis not present

## 2023-08-20 DIAGNOSIS — S0192XA Laceration with foreign body of unspecified part of head, initial encounter: Secondary | ICD-10-CM | POA: Diagnosis not present

## 2023-08-20 DIAGNOSIS — I672 Cerebral atherosclerosis: Secondary | ICD-10-CM | POA: Diagnosis not present

## 2023-08-20 DIAGNOSIS — I6782 Cerebral ischemia: Secondary | ICD-10-CM | POA: Diagnosis not present

## 2023-08-20 DIAGNOSIS — W19XXXA Unspecified fall, initial encounter: Secondary | ICD-10-CM

## 2023-08-20 DIAGNOSIS — S0993XA Unspecified injury of face, initial encounter: Secondary | ICD-10-CM | POA: Diagnosis present

## 2023-08-20 DIAGNOSIS — Z7982 Long term (current) use of aspirin: Secondary | ICD-10-CM | POA: Diagnosis not present

## 2023-08-20 DIAGNOSIS — S0081XA Abrasion of other part of head, initial encounter: Secondary | ICD-10-CM | POA: Insufficient documentation

## 2023-08-20 DIAGNOSIS — R519 Headache, unspecified: Secondary | ICD-10-CM | POA: Insufficient documentation

## 2023-08-20 DIAGNOSIS — I10 Essential (primary) hypertension: Secondary | ICD-10-CM | POA: Diagnosis not present

## 2023-08-20 DIAGNOSIS — Z681 Body mass index (BMI) 19 or less, adult: Secondary | ICD-10-CM | POA: Diagnosis not present

## 2023-08-20 MED ORDER — TETANUS-DIPHTH-ACELL PERTUSSIS 5-2.5-18.5 LF-MCG/0.5 IM SUSY
0.5000 mL | PREFILLED_SYRINGE | Freq: Once | INTRAMUSCULAR | Status: AC
  Administered 2023-08-20: 0.5 mL via INTRAMUSCULAR
  Filled 2023-08-20: qty 0.5

## 2023-08-20 NOTE — ED Provider Notes (Signed)
 [place] EMERGENCY DEPARTMENT AT [facility] Provider Note   CSN: [id number] Arrival date & time: 08/20/23  2025     Patient presents with: Katherine Hoffman is a 83 y.o. female.   HPI 83 year old female presents with a fall.  She was bending over to pick up cucumbers and lost her balance and kept bending over and hit her head on the brick.  No LOC.  Has a mild headache.  Injured her head just above her eyebrow.  She is not on any blood thinners and rarely takes aspirin.  No weakness, numbness, or neck pain.  No other injuries.  Went to urgent care and was sent here for CT scan.  Prior to Admission medications   Medication Sig Start Date End Date Taking? Authorizing Provider  aspirin EC 81 MG tablet Take 81 mg by mouth daily.    [provider]  atorvastatin  (LIPITOR) 20 MG tablet Take 1 tablet (20 mg total) by mouth daily. 09/28/20   Waddell Catholic M, DO  blood glucose meter kit and supplies KIT Dispense based on patient and insurance preference. Use to test glucose once daily. ICD - 10 code E11.9 Patient not taking: Reported on 09/28/2020 03/17/17   Alphonsa Elsie RAMAN, MD  CINNAMON PO Take 1,000 mg by mouth daily.    [provider]  Cranberry 300 MG tablet Take 300 mg by mouth daily.    [provider]  glucose blood (ONETOUCH VERIO) test strip TEST GLUCOSE ONCE DAILY 12/20/19   Waddell Catholic M, DO  ketoconazole  (NIZORAL ) 2 % cream Apply 1 application topically daily. 02/02/20   Waddell Catholic M, DO  lisinopril  (ZESTRIL ) 5 MG tablet Take 1 tablet (5 mg total) by mouth daily. 09/28/20   Waddell, Malena M, DO  meclizine  (ANTIVERT ) 12.5 MG tablet Take 1 tablet (12.5 mg total) by mouth 2 (two) times daily as needed for dizziness. Patient not taking: Reported on 09/28/2020 07/13/20   Martell [place], PA-C  Multiple Vitamin (MULTIVITAMIN) capsule Take 1 capsule by mouth daily.    [provider]  naproxen  (NAPROSYN ) 500 MG tablet Take 1 tab p.o.  daily with food. 09/28/20   Waddell Catholic M, DO  Omega-3 Fatty Acids (FISH OIL) 1000 MG CAPS Take 1 capsule by mouth daily.    [provider]  omeprazole  (PRILOSEC) 40 MG capsule TAKE 1 cap qod. 09/28/20   Waddell, Malena M, DO  ONETOUCH ULTRA test strip TESTING ONCE DAILY. 12/21/19   Waddell Catholic CHRISTELLA, DO    Allergies: Keflex [cephalexin], Theophyllines, Codeine, and Influenza vaccines    Review of Systems  Gastrointestinal:  Negative for vomiting.  Musculoskeletal:  Negative for neck pain.  Skin:  Positive for wound.  Neurological:  Positive for headaches. Negative for weakness and numbness.    Updated Vital Signs BP (!) 172/76 (BP Location: Right Arm)   Pulse 67   Temp 98.1 F (36.7 C) (Oral)   Resp 16   Ht 5' 2 (1.575 m)   Wt 46.7 kg   SpO2 100%   BMI 18.84 kg/m   Physical Exam Vitals and nursing note reviewed.  Constitutional:      Appearance: She is well-developed.  HENT:     Head: Normocephalic. Abrasion and contusion present.   Eyes:     Extraocular Movements: Extraocular movements intact.  Cardiovascular:     Rate and Rhythm: Normal rate and regular rhythm.     Heart sounds: Normal heart sounds.  Pulmonary:  Effort: Pulmonary effort is normal.     Breath sounds: Normal breath sounds.  Abdominal:     General: There is no distension.  Musculoskeletal:     Cervical back: No spinous process tenderness.  Skin:    General: Skin is warm and dry.  Neurological:     Mental Status: She is alert.     Comments: CN 3-12 grossly intact. 5/5 strength in all 4 extremities. Grossly normal sensation. Normal finger to nose.      (all labs ordered are listed, but only abnormal results are displayed) Labs Reviewed - No data to display  EKG: None  Radiology: CT Head Wo Contrast Result Date: 08/20/2023 CLINICAL DATA:  Head trauma, minor (Age >= 65y) Mechanical fall while picking cucumbers into brick. Swelling and laceration to left eyebrow. EXAM: CT HEAD  WITHOUT CONTRAST TECHNIQUE: Contiguous axial images were obtained from the base of the skull through the vertex without intravenous contrast. RADIATION DOSE REDUCTION: This exam was performed according to the departmental dose-optimization program which includes automated exposure control, adjustment of the mA and/or kV according to patient size and/or use of iterative reconstruction technique. COMPARISON:  None Available. FINDINGS: Brain: No intracranial hemorrhage, mass effect, or midline shift. Brain volume is normal for age. No hydrocephalus. The basilar cisterns are patent. Moderate periventricular and deep white matter hypodensity typical of chronic small vessel ischemia. No evidence of territorial infarct or acute ischemia. No extra-axial or intracranial fluid collection. Vascular: Atherosclerosis of skullbase vasculature without hyperdense vessel or abnormal calcification. Skull: No fracture or focal lesion. Sinuses/Orbits: No fracture of included facial bones. Paranasal sinuses and mastoid air cells are clear. The visualized orbits are unremarkable. Other: Left periorbital hematoma. IMPRESSION: 1. Left periorbital hematoma. No acute intracranial abnormality. No skull fracture. 2. Moderate chronic small vessel ischemia. Electronically Signed   By: Andrea Gasman M.D.   On: 08/20/2023 21:37     Procedures   Medications Ordered in the ED  Tdap (BOOSTRIX ) injection 0.5 mL (0.5 mLs Intramuscular Given 08/20/23 2149)                                    Medical Decision Making Amount and/or Complexity of Data Reviewed Radiology: ordered and independent interpretation performed.    Details: No head bleed  Risk Prescription drug management.   Patient presents after a fall.  There was no near-syncope or syncope preceding this and it sounds like she lost her balance leaning forward too far.  She has a mild abrasion that does not appear to need repair but was cleaned.  Head CT is unremarkable.   She otherwise has no other signs of injury.  She feels well.  Will discharge home with return precautions and discussed keeping the wound clean.  Tdap was updated.  She is noted to be hypertensive here but asymptomatic and she states she will follow-up with her PCP for this.  Will give return precautions.     Final diagnoses:  Abrasion of forehead, initial encounter  Fall, initial encounter    ED Discharge Orders     None          Freddi Hamilton, MD 08/20/23 2233

## 2023-08-20 NOTE — Discharge Instructions (Addendum)
 Keep the affected area clean with soap and water .  You should also apply ice to help with the swelling.  You may take Tylenol for pain.  If you develop new or worsening headache, vomiting, or any other new/concerning symptoms then return to the ER.

## 2023-08-20 NOTE — ED Triage Notes (Signed)
 Pt via POV with family after a mechanical fall 30 mins PTA. Pt was picking cucumbers, overbalanced, and fell face first onto a brick. Has contusion, swelling, and laceration to left eyebrow. No LOC, no thinners. Pt GCS 15.

## 2023-08-24 NOTE — Progress Notes (Unsigned)
 GI Office Note    Referring Provider: Joeann Browning, FNP Primary Care Physician:  Joeann Browning, FNP  Primary Gastroenterologist:  Chief Complaint   No chief complaint on file.    History of Present Illness   Katherine Hoffman is a 83 y.o. female presenting today    Hld, htn, glaucoma, oa, retrosternal chest pain.  Labs 01/2023: A1C 6, Cre 0.82, Na 140, Alb 4.5, Tbili 0.6, AP 106, AST 23, ALT 21, WBC 9, Hgb 15.1, MCV 98, Plt 306  EGD 06/2014: -tiny hiatal hernia -abnormal gastric mucosa of uncertain significant s/p bx gastritis, fundic gland polyp, no H.pylori  Colonoscopy 04/2013: -colonic diverticulosis. Internal hmoerrhoids  Medications   Current Outpatient Medications  Medication Sig Dispense Refill   aspirin EC 81 MG tablet Take 81 mg by mouth daily.     atorvastatin  (LIPITOR) 20 MG tablet Take 1 tablet (20 mg total) by mouth daily. 90 tablet 1   blood glucose meter kit and supplies KIT Dispense based on patient and insurance preference. Use to test glucose once daily. ICD - 10 code E11.9 (Patient not taking: Reported on 09/28/2020) 1 each 0   CINNAMON PO Take 1,000 mg by mouth daily.     Cranberry 300 MG tablet Take 300 mg by mouth daily.     glucose blood (ONETOUCH VERIO) test strip TEST GLUCOSE ONCE DAILY 100 strip 2   ketoconazole  (NIZORAL ) 2 % cream Apply 1 application topically daily. 30 g 0   lisinopril  (ZESTRIL ) 5 MG tablet Take 1 tablet (5 mg total) by mouth daily. 90 tablet 0   meclizine  (ANTIVERT ) 12.5 MG tablet Take 1 tablet (12.5 mg total) by mouth 2 (two) times daily as needed for dizziness. (Patient not taking: Reported on 09/28/2020) 15 tablet 0   Multiple Vitamin (MULTIVITAMIN) capsule Take 1 capsule by mouth daily.     naproxen  (NAPROSYN ) 500 MG tablet Take 1 tab p.o. daily with food. 90 tablet 1   Omega-3 Fatty Acids (FISH OIL) 1000 MG CAPS Take 1 capsule by mouth daily.     omeprazole  (PRILOSEC) 40 MG capsule TAKE 1 cap qod. 90 capsule 1    ONETOUCH ULTRA test strip TESTING ONCE DAILY. 50 each 0   No current facility-administered medications for this visit.    Allergies   Allergies as of 08/25/2023 - Review Complete 08/20/2023  Allergen Reaction Noted   Keflex [cephalexin] Anaphylaxis and Rash 11/13/2012   Theophyllines Anaphylaxis and Rash 11/13/2012   Codeine Other (See Comments) 07/22/2014   Influenza vaccines  05/02/2016    Past Medical History   Past Medical History:  Diagnosis Date   Diverticulosis    GERD (gastroesophageal reflux disease)    Hemorrhoids     Past Surgical History   Past Surgical History:  Procedure Laterality Date   COLONOSCOPY   12/23/2005   Dr. Rourk:Few scattered pancolonic diverticula/Remainder of the colonic mucosa appeared normal/normal rectum   COLONOSCOPY N/A 05/14/2013   Dr.Rourk- colonic diverticulosis. internal hemorrhoids   ESOPHAGOGASTRODUODENOSCOPY N/A 07/28/2014   Dr. Shaaron: tiny hiatal hernia, abnormal gastric mucosa s/p gastric biopsy revealing chronic gastritis   FOOT SURGERY      Past Family History   Family History  Problem Relation Age of Onset   Heart attack Mother    Diabetes Father    Colon cancer Other        POSSIBLY FATHER    Past Social History   Social History   Socioeconomic History   Marital status: Widowed  Spouse name: Not on file   Number of children: Not on file   Years of education: Not on file   Highest education level: Not on file  Occupational History   Occupation: Retired    Comment: [employer]  Tobacco Use   Smoking status: Never   Smokeless tobacco: Never  Substance and Sexual Activity   Alcohol use: No   Drug use: No   Sexual activity: Not on file  Other Topics Concern   Not on file  Social History Narrative   Not on file   Social Drivers of Health   Financial Resource Strain: Not on file  Food Insecurity: Not on file  Transportation Needs: Not on file  Physical Activity: Not on file  Stress: Not on file   Social Connections: Not on file  Intimate Partner Violence: Not on file    Review of Systems   General: Negative for anorexia, weight loss, fever, chills, fatigue, weakness. Eyes: Negative for vision changes.  ENT: Negative for hoarseness, difficulty swallowing , nasal congestion. CV: Negative for chest pain, angina, palpitations, dyspnea on exertion, peripheral edema.  Respiratory: Negative for dyspnea at rest, dyspnea on exertion, cough, sputum, wheezing.  GI: See history of present illness. GU:  Negative for dysuria, hematuria, urinary incontinence, urinary frequency, nocturnal urination.  MS: Negative for joint pain, low back pain.  Derm: Negative for rash or itching.  Neuro: Negative for weakness, abnormal sensation, seizure, frequent headaches, memory loss,  confusion.  Psych: Negative for anxiety, depression, suicidal ideation, hallucinations.  Endo: Negative for unusual weight change.  Heme: Negative for bruising or bleeding. Allergy: Negative for rash or hives.  Physical Exam   There were no vitals taken for this visit.   General: Well-nourished, well-developed in no acute distress.  Head: Normocephalic, atraumatic.   Eyes: Conjunctiva pink, no icterus. Mouth: Oropharyngeal mucosa moist and pink  Neck: Supple without thyromegaly, masses, or lymphadenopathy.  Lungs: Clear to auscultation bilaterally.  Heart: Regular rate and rhythm, no murmurs rubs or gallops.  Abdomen: Bowel sounds are normal, nontender, nondistended, no hepatosplenomegaly or masses,  no abdominal bruits or hernia, no rebound or guarding.   Rectal: not performed Extremities: No lower extremity edema. No clubbing or deformities.  Neuro: Alert and oriented x 4 , grossly normal neurologically.  Skin: Warm and dry, no rash or jaundice.   Psych: Alert and cooperative, normal mood and affect.  Labs   *** Imaging Studies   CT Head Wo Contrast Result Date: 08/20/2023 CLINICAL DATA:  Head trauma,  minor (Age >= 65y) Mechanical fall while picking cucumbers into brick. Swelling and laceration to left eyebrow. EXAM: CT HEAD WITHOUT CONTRAST TECHNIQUE: Contiguous axial images were obtained from the base of the skull through the vertex without intravenous contrast. RADIATION DOSE REDUCTION: This exam was performed according to the departmental dose-optimization program which includes automated exposure control, adjustment of the mA and/or kV according to patient size and/or use of iterative reconstruction technique. COMPARISON:  None Available. FINDINGS: Brain: No intracranial hemorrhage, mass effect, or midline shift. Brain volume is normal for age. No hydrocephalus. The basilar cisterns are patent. Moderate periventricular and deep white matter hypodensity typical of chronic small vessel ischemia. No evidence of territorial infarct or acute ischemia. No extra-axial or intracranial fluid collection. Vascular: Atherosclerosis of skullbase vasculature without hyperdense vessel or abnormal calcification. Skull: No fracture or focal lesion. Sinuses/Orbits: No fracture of included facial bones. Paranasal sinuses and mastoid air cells are clear. The visualized orbits are unremarkable. Other:  Left periorbital hematoma. IMPRESSION: 1. Left periorbital hematoma. No acute intracranial abnormality. No skull fracture. 2. Moderate chronic small vessel ischemia. Electronically Signed   By: Andrea Gasman M.D.   On: 08/20/2023 21:37    Assessment/Plan:       PIERRETTE Sonny RAMAN. Ezzard, MHS, PA-C [facility]

## 2023-08-24 NOTE — H&P (View-Only) (Signed)
 GI Office Note    Referring Provider: Joeann Browning, FNP Primary Care Physician:  Joeann Browning, FNP  Primary Gastroenterologist: Ozell Hollingshead, MD  Chief Complaint   Chief Complaint  Patient presents with   Chest Pain    Pt has been having chest pain, pcp did labs and referred her here for possible ulcer.     History of Present Illness   Katherine Hoffman is a 83 y.o. female presenting today for lower substernal chest pain. She presents with her daughter, [name].  Patient has some difficulty with history recall, especially with timeline. Some inconsistency with history.    She reports two week history of pain in lower central chest/epigastric area. Symptoms were constant. No diaphoresis or shortness of breath. She did not try to take anything to get relief. During this time she noted, drinking liquid, it would feel as liquid would get stuck. No heartburn previously. Although it appears that her PCP had been writing for omeprazole  40mg  daily, 90 day supply in March and June but patient states she was not taking. It is also unclear how long it has been since she took naproxen  however RX sent to [place] last month for #180.  Patient seen by PCP after about two weeks of symptoms. Had ECG, per daughter was reported to be unremarkable. Labs as outlined below. States they were advised that recent doxycycline could have contributed to mild leukocytosis. She was on doxycycline for a tick bite but cannot recall when she took medication or if she had episode of pill getting stuck.   After seen by PCP, she states a friend suggested she take OTC omeprazole . States after first pill her symptoms started subsiding.    Chronic constipation. If no BM in 3-4 days she will take an OTC laxative. No melena, brbpr. Appetite good. No weight loss.    Recent ED visit, fell in garden, hit bricks. Head CT with large left periorbital hematoma no fracture.    Labs July 09/2023: TSH 2.91,  white blood cell count 11.6, hemoglobin 15.5, MCV 99, platelets 353, glucose 108, creatinine 0.87, albumin 4.5, total bilirubin 0.6, alk phos 99, AST 24, ALT 21, total CK 113, CK-MB 2.2, troponin 8, H. pylori breath test negative  EGD 06/2014: -tiny hiatal hernia -abnormal gastric mucosa of uncertain significant s/p bx gastritis, fundic gland polyp, no H.pylori  Colonoscopy 04/2013: -colonic diverticulosis. Internal hmoerrhoids  Medications   Current Outpatient Medications  Medication Sig Dispense Refill   atorvastatin  (LIPITOR) 20 MG tablet Take 1 tablet (20 mg total) by mouth daily. 90 tablet 1   brimonidine (ALPHAGAN) 0.2 % ophthalmic solution Place 1 drop into both eyes 2 (two) times daily.     CINNAMON PO Take 1,000 mg by mouth daily.     Cranberry 300 MG tablet Take 300 mg by mouth daily.     lisinopril  (ZESTRIL ) 10 MG tablet Take 20 mg by mouth daily.     Multiple Vitamin (MULTIVITAMIN) capsule Take 1 capsule by mouth daily.     naproxen  (NAPROSYN ) 500 MG tablet Take 1 tab p.o. daily with food. 90 tablet 1   Omega-3 Fatty Acids (FISH OIL) 1000 MG CAPS Take 1 capsule by mouth daily.     omeprazole  (PRILOSEC) 40 MG capsule TAKE 1 cap qod. 90 capsule 1   timolol (TIMOPTIC) 0.5 % ophthalmic solution Place 1 drop into both eyes 2 (two) times daily.     No current facility-administered medications for this visit.  Allergies   Allergies as of 08/25/2023 - Review Complete 08/25/2023  Allergen Reaction Noted   Keflex [cephalexin] Anaphylaxis and Rash 11/13/2012   Theophyllines Anaphylaxis and Rash 11/13/2012   Codeine Other (See Comments) 07/22/2014   Influenza a (h1n1) monovalent vaccine  08/25/2023   Influenza vaccines  05/02/2016    Past Medical History   Past Medical History:  Diagnosis Date   Diverticulosis    GERD (gastroesophageal reflux disease)    Glaucoma    Hemorrhoids    HTN (hypertension)    Hyperlipidemia    Osteoarthritis     Past Surgical History    Past Surgical History:  Procedure Laterality Date   COLONOSCOPY   12/23/2005   Dr. Rourk:Few scattered pancolonic diverticula/Remainder of the colonic mucosa appeared normal/normal rectum   COLONOSCOPY N/A 05/14/2013   Dr.Rourk- colonic diverticulosis. internal hemorrhoids   ESOPHAGOGASTRODUODENOSCOPY N/A 07/28/2014   Dr. Shaaron: tiny hiatal hernia, abnormal gastric mucosa s/p gastric biopsy revealing chronic gastritis   FOOT SURGERY      Past Family History   Family History  Problem Relation Age of Onset   Heart attack Mother    Diabetes Father    Colon cancer Other        POSSIBLY FATHER    Past Social History   Social History   Socioeconomic History   Marital status: Widowed    Spouse name: Not on file   Number of children: Not on file   Years of education: Not on file   Highest education level: Not on file  Occupational History   Occupation: Retired    Comment: [employer]  Tobacco Use   Smoking status: Never   Smokeless tobacco: Never  Substance and Sexual Activity   Alcohol use: No   Drug use: No   Sexual activity: Not on file  Other Topics Concern   Not on file  Social History Narrative   Not on file   Social Drivers of Health   Financial Resource Strain: Not on file  Food Insecurity: Not on file  Transportation Needs: Not on file  Physical Activity: Not on file  Stress: Not on file  Social Connections: Not on file  Intimate Partner Violence: Not on file    Review of Systems   General: Negative for anorexia, weight loss, fever, chills, fatigue, weakness. Recent fall with left periorbital bruising. Eyes: Negative for vision changes.  ENT: Negative for hoarseness, difficulty swallowing , nasal congestion.see hpi CV: Negative for chest pain, angina, palpitations, dyspnea on exertion, peripheral edema.  Respiratory: Negative for dyspnea at rest, dyspnea on exertion, cough, sputum, wheezing.  GI: See history of present illness. GU:  Negative  for dysuria, hematuria, urinary incontinence, urinary frequency, nocturnal urination.  MS: Negative for joint pain, low back pain.  Derm: Negative for rash or itching.  Neuro: Negative for weakness, abnormal sensation, seizure, frequent headaches, ?memory loss,  confusion.  Psych: Negative for anxiety, depression, suicidal ideation, hallucinations.  Endo: Negative for unusual weight change.  Heme: Negative for bruising or bleeding. Allergy: Negative for rash or hives.  Physical Exam   BP (!) 148/81 (BP Location: Left Arm, Patient Position: Sitting, Cuff Size: Normal)   Pulse 67   Temp 98.9 F (37.2 C) (Oral)   Ht 5' 2 (1.575 m)   Wt 109 lb (49.4 kg)   SpO2 97%   BMI 19.94 kg/m    General: Well-nourished, well-developed in no acute distress. Periorbital bruising, left eye with resolving lateral bruise. Significant black eye persists.  Head: Normocephalic, atraumatic.   Eyes: Conjunctiva pink, no icterus. Mouth: Oropharyngeal mucosa moist and pink  Neck: Supple without thyromegaly, masses, or lymphadenopathy.  Lungs: Clear to auscultation bilaterally.  Heart: Regular rate and rhythm, no murmurs rubs or gallops.  Abdomen: Bowel sounds are normal, nontender, nondistended, no hepatosplenomegaly or masses,  no abdominal bruits or hernia, no rebound or guarding.   Rectal: not performed Extremities: No lower extremity edema. No clubbing or deformities.  Neuro: Alert and oriented x 4 , grossly normal neurologically.  Skin: Warm and dry, no rash or jaundice.   Psych: Alert and cooperative, normal mood and affect.  Labs   See hpi  Imaging Studies   CT Head Wo Contrast Result Date: 08/20/2023 CLINICAL DATA:  Head trauma, minor (Age >= 65y) Mechanical fall while picking cucumbers into brick. Swelling and laceration to left eyebrow. EXAM: CT HEAD WITHOUT CONTRAST TECHNIQUE: Contiguous axial images were obtained from the base of the skull through the vertex without intravenous contrast.  RADIATION DOSE REDUCTION: This exam was performed according to the departmental dose-optimization program which includes automated exposure control, adjustment of the mA and/or kV according to patient size and/or use of iterative reconstruction technique. COMPARISON:  None Available. FINDINGS: Brain: No intracranial hemorrhage, mass effect, or midline shift. Brain volume is normal for age. No hydrocephalus. The basilar cisterns are patent. Moderate periventricular and deep white matter hypodensity typical of chronic small vessel ischemia. No evidence of territorial infarct or acute ischemia. No extra-axial or intracranial fluid collection. Vascular: Atherosclerosis of skullbase vasculature without hyperdense vessel or abnormal calcification. Skull: No fracture or focal lesion. Sinuses/Orbits: No fracture of included facial bones. Paranasal sinuses and mastoid air cells are clear. The visualized orbits are unremarkable. Other: Left periorbital hematoma. IMPRESSION: 1. Left periorbital hematoma. No acute intracranial abnormality. No skull fracture. 2. Moderate chronic small vessel ischemia. Electronically Signed   By: Andrea Gasman M.D.   On: 08/20/2023 21:37    Assessment/Plan:   Esophageal pain/epigastric pain/vague esophageal dysphagia: -patient difficult historian, ?some memory deficits -reports 2 week history of significant lower chest/retrosternal pain associated with difficulty swallowing liquids.  -symptoms possible shortly after taking doxycycline, cannot rule out pill-induced esophagitis -symptoms could be due to other esophagitis/gastritis/PUD in setting of NSAID use -unclear if she has been on chronic omeprazole  or just recently starting, see above -unclear if actively using naprosyn , has active refills -discussed with patient and daughter, recommend EGD for further evaluation. Patient does say she is feeling better but I would recommend EGD given history discrepancies, likely ongoing  naprosyn  use. Daughter also requesting EGD. Patient is agreeable.  -EGD+/-ED. ASA 2.  I have discussed the risks, alternatives, benefits with regards to but not limited to the risk of reaction to medication, bleeding, infection, perforation and the patient is agreeable to proceed. Written consent to be obtained. -omeprazole  40mg  daily before breakfast   -recommending holding off on naprosyn  for now -call in interim if recurrent symptoms.    Sonny RAMAN. Ezzard, MHS, PA-C [facility]

## 2023-08-25 ENCOUNTER — Encounter: Payer: Self-pay | Admitting: Gastroenterology

## 2023-08-25 ENCOUNTER — Encounter: Payer: Self-pay | Admitting: *Deleted

## 2023-08-25 ENCOUNTER — Telehealth: Payer: Self-pay | Admitting: *Deleted

## 2023-08-25 ENCOUNTER — Ambulatory Visit: Admitting: Gastroenterology

## 2023-08-25 VITALS — BP 148/81 | HR 67 | Temp 98.9°F | Ht 62.0 in | Wt 109.0 lb

## 2023-08-25 DIAGNOSIS — K2289 Other specified disease of esophagus: Secondary | ICD-10-CM | POA: Insufficient documentation

## 2023-08-25 DIAGNOSIS — R1013 Epigastric pain: Secondary | ICD-10-CM | POA: Insufficient documentation

## 2023-08-25 DIAGNOSIS — R1310 Dysphagia, unspecified: Secondary | ICD-10-CM | POA: Insufficient documentation

## 2023-08-25 DIAGNOSIS — R1319 Other dysphagia: Secondary | ICD-10-CM

## 2023-08-25 NOTE — Telephone Encounter (Signed)
 Pt was informed that will get a pre-op telephone call on Thursday 08/28/23. Verbalized understanding.

## 2023-08-25 NOTE — Patient Instructions (Addendum)
 Recommend omeprazole  40mg  daily before breakfast. You should have RX at omeprazole  if you do not have it at home already.  Upper endoscopy to be scheduled.

## 2023-08-28 ENCOUNTER — Encounter (HOSPITAL_COMMUNITY)
Admission: RE | Admit: 2023-08-28 | Discharge: 2023-08-28 | Disposition: A | Discharge status: Home / Self Care | Source: Ambulatory Visit | Attending: Internal Medicine | Admitting: Internal Medicine

## 2023-08-28 ENCOUNTER — Encounter (HOSPITAL_COMMUNITY): Payer: Self-pay

## 2023-08-28 DIAGNOSIS — I10 Essential (primary) hypertension: Secondary | ICD-10-CM

## 2023-08-28 DIAGNOSIS — Z7689 Persons encountering health services in other specified circumstances: Secondary | ICD-10-CM | POA: Diagnosis not present

## 2023-08-28 DIAGNOSIS — S01112D Laceration without foreign body of left eyelid and periocular area, subsequent encounter: Secondary | ICD-10-CM | POA: Diagnosis not present

## 2023-08-28 NOTE — Pre-Procedure Instructions (Signed)
 Attempted pre-op phonecall. Left VM for her to call us back.

## 2023-08-29 DIAGNOSIS — M720 Palmar fascial fibromatosis [Dupuytren]: Secondary | ICD-10-CM | POA: Diagnosis not present

## 2023-08-29 DIAGNOSIS — M19042 Primary osteoarthritis, left hand: Secondary | ICD-10-CM | POA: Diagnosis not present

## 2023-08-29 DIAGNOSIS — M19041 Primary osteoarthritis, right hand: Secondary | ICD-10-CM | POA: Diagnosis not present

## 2023-09-01 ENCOUNTER — Ambulatory Visit (HOSPITAL_BASED_OUTPATIENT_CLINIC_OR_DEPARTMENT_OTHER)

## 2023-09-01 ENCOUNTER — Other Ambulatory Visit: Payer: Self-pay

## 2023-09-01 ENCOUNTER — Encounter (HOSPITAL_COMMUNITY): Payer: Self-pay | Admitting: Internal Medicine

## 2023-09-01 ENCOUNTER — Encounter (HOSPITAL_COMMUNITY)
Admission: RE | Disposition: A | Discharge status: Home / Self Care | Payer: Self-pay | Source: Home / Self Care | Attending: Internal Medicine

## 2023-09-01 ENCOUNTER — Ambulatory Visit (HOSPITAL_COMMUNITY)
Admission: RE | Admit: 2023-09-01 | Discharge: 2023-09-01 | Disposition: A | Discharge status: Home / Self Care | Attending: Internal Medicine | Admitting: Internal Medicine

## 2023-09-01 ENCOUNTER — Ambulatory Visit (HOSPITAL_COMMUNITY)

## 2023-09-01 DIAGNOSIS — K319 Disease of stomach and duodenum, unspecified: Secondary | ICD-10-CM | POA: Diagnosis not present

## 2023-09-01 DIAGNOSIS — R1310 Dysphagia, unspecified: Secondary | ICD-10-CM

## 2023-09-01 DIAGNOSIS — Z79899 Other long term (current) drug therapy: Secondary | ICD-10-CM | POA: Diagnosis not present

## 2023-09-01 DIAGNOSIS — K5909 Other constipation: Secondary | ICD-10-CM | POA: Diagnosis not present

## 2023-09-01 DIAGNOSIS — I10 Essential (primary) hypertension: Secondary | ICD-10-CM | POA: Insufficient documentation

## 2023-09-01 DIAGNOSIS — K3189 Other diseases of stomach and duodenum: Secondary | ICD-10-CM | POA: Insufficient documentation

## 2023-09-01 DIAGNOSIS — K219 Gastro-esophageal reflux disease without esophagitis: Secondary | ICD-10-CM | POA: Insufficient documentation

## 2023-09-01 HISTORY — PX: ESOPHAGOGASTRODUODENOSCOPY: SHX5428

## 2023-09-01 HISTORY — PX: ESOPHAGEAL DILATION: SHX303

## 2023-09-01 SURGERY — EGD (ESOPHAGOGASTRODUODENOSCOPY)
Anesthesia: General

## 2023-09-01 MED ORDER — PROPOFOL 500 MG/50ML IV EMUL
INTRAVENOUS | Status: DC | PRN
Start: 2023-09-01 — End: 2023-09-01
  Administered 2023-09-01: 150 ug/kg/min via INTRAVENOUS

## 2023-09-01 MED ORDER — LIDOCAINE 2% (20 MG/ML) 5 ML SYRINGE
INTRAMUSCULAR | Status: DC | PRN
  Administered 2023-09-01: 60 mg via INTRAVENOUS

## 2023-09-01 MED ORDER — PROPOFOL 10 MG/ML IV BOLUS
INTRAVENOUS | Status: DC | PRN
Start: 2023-09-01 — End: 2023-09-01
  Administered 2023-09-01: 50 mg via INTRAVENOUS

## 2023-09-01 MED ORDER — LACTATED RINGERS IV SOLN: INTRAVENOUS | Status: DC

## 2023-09-01 NOTE — Anesthesia Postprocedure Evaluation (Signed)
 Anesthesia Post Note  Patient: Katherine Hoffman  Procedure(s) Performed: EGD (ESOPHAGOGASTRODUODENOSCOPY) DILATION, ESOPHAGUS  Patient location during evaluation: PACU Anesthesia Type: General Level of consciousness: awake and alert Pain management: pain level controlled Vital Signs Assessment: post-procedure vital signs reviewed and stable Respiratory status: spontaneous breathing, nonlabored ventilation, respiratory function stable and patient connected to nasal cannula oxygen Cardiovascular status: stable and blood pressure returned to baseline Postop Assessment: no apparent nausea or vomiting Anesthetic complications: no   No notable events documented.   Last Vitals:  Vitals:   09/01/23 0955 09/01/23 1159  BP: (!) 186/62 (!) 162/54  Pulse: (!) 52 (!) 52  Resp: 16 (!) 24  Temp:  37.1 C  SpO2: 100% 98%    Last Pain:  Vitals:   09/01/23 1159  TempSrc: Oral  PainSc:                  Andrea Limes

## 2023-09-01 NOTE — Transfer of Care (Signed)
 Immediate Anesthesia Transfer of Care Note  Patient: Katherine Hoffman  Procedure(s) Performed: EGD (ESOPHAGOGASTRODUODENOSCOPY) DILATION, ESOPHAGUS  Patient Location: Short Stay  Anesthesia Type:General  Level of Consciousness: awake, alert , and patient cooperative  Airway & Oxygen Therapy: Patient Spontanous Breathing  Post-op Assessment: Report given to RN, Post -op Vital signs reviewed and stable, and Patient moving all extremities X 4  Post vital signs: Reviewed and stable  Last Vitals:  Vitals Value Taken Time  BP 162/54 09/01/23 11:59  Temp 37.1 C 09/01/23 11:59  Pulse 52 09/01/23 11:59  Resp 24 09/01/23 11:59  SpO2 98 % 09/01/23 11:59    Last Pain:  Vitals:   09/01/23 1159  TempSrc: Oral  PainSc:          Complications: No notable events documented.

## 2023-09-01 NOTE — Discharge Instructions (Signed)
 EGD Discharge instructions Please read the instructions outlined below and refer to this sheet in the next few weeks. These discharge instructions provide you with general information on caring for yourself after you leave the hospital. Your doctor may also give you specific instructions. While your treatment has been planned according to the most current medical practices available, unavoidable complications occasionally occur. If you have any problems or questions after discharge, please call your doctor. ACTIVITY You may resume your regular activity but move at a slower pace for the next 24 hours.  Take frequent rest periods for the next 24 hours.  Walking will help expel (get rid of) the air and reduce the bloated feeling in your abdomen.  No driving for 24 hours (because of the anesthesia (medicine) used during the test).  You may shower.  Do not sign any important legal documents or operate any machinery for 24 hours (because of the anesthesia used during the test).  NUTRITION Drink plenty of fluids.  You may resume your normal diet.  Begin with a light meal and progress to your normal diet.  Avoid alcoholic beverages for 24 hours or as instructed by your caregiver.  MEDICATIONS You may resume your normal medications unless your caregiver tells you otherwise.  WHAT YOU CAN EXPECT TODAY You may experience abdominal discomfort such as a feeling of fullness or "gas" pains.  FOLLOW-UP Your doctor will discuss the results of your test with you.  SEEK IMMEDIATE MEDICAL ATTENTION IF ANY OF THE FOLLOWING OCCUR: Excessive nausea (feeling sick to your stomach) and/or vomiting.  Severe abdominal pain and distention (swelling).  Trouble swallowing.  Temperature over 101 F (37.8 C).  Rectal bleeding or vomiting of blood.       Your esophagus was somewhat narrowed.  It was stretched today.  Your stomach appeared somewhat inflamed-biopsies taken.     Continue omeprazole  40 mg  daily  Whenever comes to swallowing medication make sure you stay upright for 30 minutes after taking it.  Ensure that you take adequate fluids to make sure that medications reach your stomach.  Further recommendations to follow pending review of pathology report  Office visit with me in 3 months   I spoke to [employer], daughter, [phone]

## 2023-09-01 NOTE — Anesthesia Preprocedure Evaluation (Addendum)
 Anesthesia Evaluation  Patient identified by MRN, date of birth, ID band Patient awake    Reviewed: Allergy & Precautions, H&P , NPO status , Patient's Chart, lab work & pertinent test results  Airway Mallampati: II  TM Distance: >3 FB Neck ROM: Full    Dental no notable dental hx.    Pulmonary neg pulmonary ROS   Pulmonary exam normal breath sounds clear to auscultation       Cardiovascular hypertension, Normal cardiovascular exam Rhythm:Regular Rate:Normal     Neuro/Psych  Neuromuscular disease  negative psych ROS   GI/Hepatic Neg liver ROS,GERD  ,,  Endo/Other    Renal/GU negative Renal ROS  negative genitourinary   Musculoskeletal  (+) Arthritis ,    Abdominal   Peds negative pediatric ROS (+)  Hematology negative hematology ROS (+)   Anesthesia Other Findings Pt fell two weeks ago and hit head on brick wall CT scan negative for any fractures Pt denies any syncope prior to fall; states her feet became tangled while gardening Significant bruising left eye Denies any symptoms of HA/dizziness  Reproductive/Obstetrics negative OB ROS                              Anesthesia Physical Anesthesia Plan  ASA: 2  Anesthesia Plan: General   Post-op Pain Management:    Induction: Intravenous  PONV Risk Score and Plan: Propofol  infusion  Airway Management Planned: Nasal Cannula  Additional Equipment:   Intra-op Plan:   Post-operative Plan:   Informed Consent: I have reviewed the patients History and Physical, chart, labs and discussed the procedure including the risks, benefits and alternatives for the proposed anesthesia with the patient or authorized representative who has indicated his/her understanding and acceptance.     Dental advisory given  Plan Discussed with: CRNA  Anesthesia Plan Comments:          Anesthesia Quick Evaluation

## 2023-09-01 NOTE — Interval H&P Note (Signed)
 History and Physical Interval Note:  09/01/2023 11:25 AM  Katherine Hoffman  has presented today for surgery, with the diagnosis of dypshagia,epigastric pain,esophageal pain.  The various methods of treatment have been discussed with the patient and family. After consideration of risks, benefits and other options for treatment, the patient has consented to  Procedure(s) with comments: EGD (ESOPHAGOGASTRODUODENOSCOPY) (N/A) - 10:30 am, asa 2 DILATION, ESOPHAGUS (N/A) - 10:30 am, asa 2 as a surgical intervention.  The patient's history has been reviewed, patient examined, no change in status, stable for surgery.  I have reviewed the patient's chart and labs.  Questions were answered to the patient's satisfaction.     Katherine Hoffman   patient seen and examined no change chest pain improving.  Baseline downplays any dysphagia.  High suspicion for pill induced esophageal injury recently.  I have offered the patient EGD with esophageal dilation as feasible/appropriate per plan.  The risks, benefits, limitations, alternatives and imponderables have been reviewed with the patient. Potential for esophageal dilation, biopsy, etc. have also been reviewed.  Questions have been answered. All parties agreeable.

## 2023-09-01 NOTE — Op Note (Signed)
 [facility] Patient Name: Katherine Hoffman Procedure Date: 09/01/2023 11:17 AM MRN: [id number] Date of Birth: Sep 14, 1940 Attending MD: Lamar Ozell Hollingshead , MD, [id number] CSN: [id number] Age: 83 Admit Type: Outpatient Procedure:                Upper GI endoscopy Indications:              Dysphagia, Odynophagia Providers:                Lamar Ozell Hollingshead, MD, Devere Lodge, Jon Loge Referring MD:              Medicines:                Propofol  per Anesthesia Complications:            No immediate complications. Estimated Blood Loss:     Estimated blood loss was minimal. Procedure:                Pre-Anesthesia Assessment:                           - Prior to the procedure, a History and Physical                            was performed, and patient medications and                            allergies were reviewed. The patient's tolerance of                            previous anesthesia was also reviewed. The risks                            and benefits of the procedure and the sedation                            options and risks were discussed with the patient.                            All questions were answered, and informed consent                            was obtained. Prior Anticoagulants: The patient has                            taken no anticoagulant or antiplatelet agents. ASA                            Grade Assessment: III - A patient with severe                            systemic disease. After reviewing the risks and  benefits, the patient was deemed in satisfactory                            condition to undergo the procedure.                           After obtaining informed consent, the endoscope was                            passed under direct vision. Throughout the                            procedure, the patient's blood pressure, pulse, and                            oxygen saturations  were monitored continuously. The                            [id number]-H190 ([id number]) scope was introduced through the                            mouth, and advanced to the second part of duodenum.                            The upper GI endoscopy was accomplished without                            difficulty. The patient tolerated the procedure                            well. Scope In: 11:43:12 AM Scope Out: 11:51:21 AM Total Procedure Duration: 0 hours 8 minutes 9 seconds  Findings:      The examined esophagus was normal. Gastric mucosal changes consistent       with GAVE. No ulcer or infiltrating process.      The duodenal bulb and second portion of the duodenum were normal. The       scope was withdrawn. Dilation was performed with a Maloney dilator with       mild resistance at 54 Fr. The dilation site was examined following       endoscope reinsertion and showed no change. Estimated blood loss was       minimal. Finally, biopsies of the abnormal appearing gastric mucosa       taken for histologic study Impression:               - Normal esophagus. Dilated. Abnormal stomach                            consistent with GAVE. Status post biopsy                           - Normal duodenal bulb and second portion of the                            duodenum.                           -  Clinical scenario most consistent with pill                            induced or chemical induced esophagitis which is                            almost always self-limiting. Moderate Sedation:      Moderate (conscious) sedation was personally administered by an       anesthesia professional. The following parameters were monitored: oxygen       saturation, heart rate, blood pressure, respiratory rate, EKG, adequacy       of pulmonary ventilation, and response to care. Recommendation:           - Patient has a contact number available for                            emergencies. The signs and symptoms of  potential                            delayed complications were discussed with the                            patient. Return to normal activities tomorrow.                            Written discharge instructions were provided to the                            patient.                           - Advance diet as tolerated. Continue omeprazole  40                            mg daily. Swallowing precautions reviewed.                            Follow-up on pathology.                           - Office visit with me in 3 months. Procedure Code(s):        --- Professional ---                           [phone], Esophagogastroduodenoscopy, flexible,                            transoral; diagnostic, including collection of                            specimen(s) by brushing or washing, when performed                            (separate procedure)  43450, Dilation of esophagus, by unguided sound or                            bougie, single or multiple passes Diagnosis Code(s):        --- Professional ---                           R13.10, Dysphagia, unspecified CPT copyright 2022 American Medical Association. All rights reserved. The codes documented in this report are preliminary and upon coder review may  be revised to meet current compliance requirements. Lamar HERO. Gabrielle Mester, MD Lamar Ozell Hollingshead, MD 09/01/2023 12:02:58 PM This report has been signed electronically. Number of Addenda: 0

## 2023-09-02 ENCOUNTER — Encounter (HOSPITAL_COMMUNITY): Payer: Self-pay | Admitting: Internal Medicine

## 2023-09-02 LAB — SURGICAL PATHOLOGY

## 2023-09-04 ENCOUNTER — Ambulatory Visit: Payer: Self-pay | Admitting: Internal Medicine

## 2023-09-11 DIAGNOSIS — R944 Abnormal results of kidney function studies: Secondary | ICD-10-CM | POA: Diagnosis not present

## 2023-09-11 DIAGNOSIS — S01112D Laceration without foreign body of left eyelid and periocular area, subsequent encounter: Secondary | ICD-10-CM | POA: Diagnosis not present

## 2023-09-11 DIAGNOSIS — I10 Essential (primary) hypertension: Secondary | ICD-10-CM | POA: Diagnosis not present

## 2023-11-17 DIAGNOSIS — H401131 Primary open-angle glaucoma, bilateral, mild stage: Secondary | ICD-10-CM | POA: Diagnosis not present

## 2023-11-20 ENCOUNTER — Encounter: Payer: Self-pay | Admitting: Internal Medicine

## 2023-11-26 ENCOUNTER — Telehealth: Payer: Self-pay | Admitting: Gastroenterology

## 2023-11-26 NOTE — Telephone Encounter (Signed)
 Patient called stated she received a letter to make a follow up appointment with us  post procedure. Patient is not having any issues and does not want to make an appointment at this time.

## 2024-02-09 ENCOUNTER — Encounter: Payer: Self-pay | Admitting: Physician Assistant

## 2024-03-31 ENCOUNTER — Encounter: Payer: Self-pay | Admitting: Physician Assistant
